# Patient Record
Sex: Female | Born: 1940 | Race: White | Hispanic: No | Smoking: Never smoker
Health system: Southern US, Community
[De-identification: ages and names within clinical notes are randomized; demographics above are authoritative.]

## PROBLEM LIST (undated history)

## (undated) DIAGNOSIS — G43909 Migraine, unspecified, not intractable, without status migrainosus: Secondary | ICD-10-CM

## (undated) DIAGNOSIS — K219 Gastro-esophageal reflux disease without esophagitis: Secondary | ICD-10-CM

## (undated) HISTORY — DX: Migraine, unspecified, not intractable, without status migrainosus: G43.909

## (undated) HISTORY — PX: TUBAL LIGATION: SHX77

## (undated) HISTORY — DX: Gastro-esophageal reflux disease without esophagitis: K21.9

## (undated) HISTORY — PX: OTHER SURGICAL HISTORY: SHX169

## (undated) HISTORY — PX: TONSILLECTOMY: SUR1361

---

## 2016-02-07 DIAGNOSIS — N840 Polyp of corpus uteri: Secondary | ICD-10-CM

## 2016-02-07 DIAGNOSIS — G43909 Migraine, unspecified, not intractable, without status migrainosus: Secondary | ICD-10-CM | POA: Insufficient documentation

## 2016-02-07 DIAGNOSIS — I10 Essential (primary) hypertension: Secondary | ICD-10-CM | POA: Insufficient documentation

## 2016-02-07 DIAGNOSIS — K219 Gastro-esophageal reflux disease without esophagitis: Secondary | ICD-10-CM

## 2016-02-07 DIAGNOSIS — R82998 Other abnormal findings in urine: Secondary | ICD-10-CM

## 2016-02-07 DIAGNOSIS — R300 Dysuria: Secondary | ICD-10-CM | POA: Insufficient documentation

## 2016-02-07 DIAGNOSIS — I471 Supraventricular tachycardia, unspecified: Secondary | ICD-10-CM

## 2016-02-07 DIAGNOSIS — E7849 Other hyperlipidemia: Secondary | ICD-10-CM

## 2016-02-07 HISTORY — DX: Dysuria: R30.0

## 2016-02-07 HISTORY — DX: Gastro-esophageal reflux disease without esophagitis: K21.9

## 2016-02-07 HISTORY — DX: Essential (primary) hypertension: I10

## 2016-02-07 HISTORY — DX: Supraventricular tachycardia, unspecified: I47.10

## 2016-02-07 HISTORY — DX: Polyp of corpus uteri: N84.0

## 2016-02-07 HISTORY — DX: Other hyperlipidemia: E78.49

## 2016-02-07 HISTORY — DX: Migraine, unspecified, not intractable, without status migrainosus: G43.909

## 2016-02-07 HISTORY — DX: Other abnormal findings in urine: R82.998

## 2017-06-28 ENCOUNTER — Encounter: Payer: Self-pay | Admitting: Cardiology

## 2017-06-29 ENCOUNTER — Ambulatory Visit (INDEPENDENT_AMBULATORY_CARE_PROVIDER_SITE_OTHER): Payer: Medicare Other | Admitting: Cardiology

## 2017-06-29 ENCOUNTER — Encounter: Payer: Self-pay | Admitting: Cardiology

## 2017-06-29 VITALS — BP 142/86 | HR 68 | Ht 64.0 in | Wt 145.5 lb

## 2017-06-29 DIAGNOSIS — I10 Essential (primary) hypertension: Secondary | ICD-10-CM | POA: Diagnosis not present

## 2017-06-29 DIAGNOSIS — I471 Supraventricular tachycardia: Secondary | ICD-10-CM

## 2017-06-29 DIAGNOSIS — E784 Other hyperlipidemia: Secondary | ICD-10-CM

## 2017-06-29 DIAGNOSIS — E7849 Other hyperlipidemia: Secondary | ICD-10-CM

## 2017-06-29 NOTE — Progress Notes (Signed)
Cardiology Office Note:    Date:  06/29/2017   ID:  Melanie Hampton, DOB July 15, 1941, MRN [id number]  PCP:  Irena Reichmann, DO  Cardiologist:  Norman Herrlich, MD    Referring MD: No ref. provider found    ASSESSMENT:    1. Benign essential hypertension   2. Paroxysmal supraventricular tachycardia (HCC) Chronic  3. Familial combined hyperlipidemia    PLAN:    In order of problems listed above:  1. Stable blood pressure target home runs less than 1:30 systolic and continue current treatment including beta blocker and diuretic. She is at risk for renal insufficiency and hypokalemia and labs were rechecked today 2. Stable continue beta blocker avoid over-the-counter proarrhythmic's 3. Stable continue her statin check liver function for toxicity and lipid profile for efficacy.  Next appointment: 6 months   Medication Adjustments/Labs and Tests Ordered: Current medicines are reviewed at length with the patient today.  Concerns regarding medicines are outlined above.  No orders of the defined types were placed in this encounter.  No orders of the defined types were placed in this encounter.   Chief Complaint  Patient presents with  . Follow-up    Routine follow up, pt has not been seen in 2 or more years  . Palpitations    pt states that she has occasional skipped beats    History of Present Illness:    Melanie Hampton is a 76 y.o. female with a hx of hypertension, hyperlipidemia and PSVT. Compliance with diet, lifestyle and medications: Yes Is still grieving the death of her husband and some minimal palpitations not severe sustained overall is pleased with all her compliant with medications and overdue for office follow-up lab studies. She has had no angina or dyspnea syncope or TIA. She avoids over-the-counter proarrhythmic medications. Past Medical History:  Diagnosis Date  . GERD (gastroesophageal reflux disease)   . Migraine     Past Surgical History:  Procedure Laterality  Date  . TONSILLECTOMY    . TUBAL LIGATION    . uterus polyp removal      Current Medications: Current Meds  Medication Sig  . Ascorbic Acid (VITAMIN C) 100 MG tablet Take 100 mg by mouth daily.  Marland Kitchen aspirin EC 81 MG tablet Take 81 mg by mouth daily.  Marland Kitchen atorvastatin (LIPITOR) 10 MG tablet Take 10 mg by mouth daily.  Marland Kitchen CRANBERRY PO Take by mouth. Takes as needed  . Loratadine-Pseudoephedrine (CLARITIN-D 24 HOUR PO) Take by mouth. Takes one tablet daily  . propranolol ER (INDERAL LA) 80 MG 24 hr capsule Take 80 mg by mouth daily.  . solifenacin (VESICARE) 5 MG tablet Take 5 mg by mouth daily.  Marland Kitchen triamterene-hydrochlorothiazide (MAXZIDE-25) 37.5-25 MG tablet Take by mouth. Takes one daily po daily     Allergies:   Niacin; Penicillin g; and Amoxicillin-pot clavulanate   Social History   Social History  . Marital status: Widowed    Spouse name: N/A  . Number of children: N/A  . Years of education: N/A   Social History Main Topics  . Smoking status: Never Smoker  . Smokeless tobacco: Never Used  . Alcohol use None  . Drug use: Unknown  . Sexual activity: Not Asked   Other Topics Concern  . None   Social History Narrative  . None     Family History: The patient's family history includes Cancer in her sister; Hypertension in her mother; Stroke in her father. ROS:   Please see the history of present illness.  All other systems reviewed and are negative.  EKGs/Labs/Other Studies Reviewed:    The following studies were reviewed today:   EKG:  EKG is  ordered today.  The ekg ordered today demonstrates Sinus rhythm left bundle branch block.  Recent Labs: No results found for requested labs within last 8760 hours.  Recent Lipid Panel No results found for: CHOL, TRIG, HDL, CHOLHDL, VLDL, LDLCALC, LDLDIRECT  Physical Exam:    VS:  BP (!) 142/86   Pulse 68   Ht 5\' 4"  (1.626 m)   Wt 145 lb 8 oz (66 kg)   SpO2 99%   BMI 24.98 kg/m     Wt Readings from Last 3  Encounters:  06/29/17 145 lb 8 oz (66 kg)     GEN:  Well nourished, well developed in no acute distress HEENT: Normal NECK: No JVD; No carotid bruits LYMPHATICS: No lymphadenopathy CARDIAC: RRR, no murmurs, rubs, gallops RESPIRATORY:  Clear to auscultation without rales, wheezing or rhonchi  ABDOMEN: Soft, non-tender, non-distended MUSCULOSKELETAL:  No edema; No deformity  SKIN: Warm and dry NEUROLOGIC:  Alert and oriented x 3 PSYCHIATRIC:  Normal affect    Signed, Norman HerrlichBrian Munley, MD  06/29/2017 11:29 AM    [facility]

## 2017-06-29 NOTE — Patient Instructions (Signed)
Medication Instructions:  Your physician recommends that you continue on your current medications as directed. Please refer to the Current Medication list given to you today.   Labwork: Your physician recommends that you return for lab work in: today. CMP, lipids.   Testing/Procedures: You had an EKG at your visit today.  Follow-Up: Your physician wants you to follow-up in: 6 months You will receive a reminder letter in the mail two months in advance. If you don't receive a letter, please call our office to schedule the follow-up appointment.   Any Other Special Instructions Will Be Listed Below (If Applicable).     If you need a refill on your cardiac medications before your next appointment, please call your pharmacy.

## 2017-06-30 LAB — COMPREHENSIVE METABOLIC PANEL
ALBUMIN: 4.7 g/dL (ref 3.5–4.8)
ALT: 17 IU/L (ref 0–32)
AST: 22 IU/L (ref 0–40)
Albumin/Globulin Ratio: 2.2 (ref 1.2–2.2)
Alkaline Phosphatase: 105 IU/L (ref 39–117)
BILIRUBIN TOTAL: 0.7 mg/dL (ref 0.0–1.2)
BUN / CREAT RATIO: 13 (ref 12–28)
BUN: 11 mg/dL (ref 8–27)
CO2: 28 mmol/L (ref 20–29)
CREATININE: 0.84 mg/dL (ref 0.57–1.00)
Calcium: 10.2 mg/dL (ref 8.7–10.3)
Chloride: 102 mmol/L (ref 96–106)
GFR calc Af Amer: 77 mL/min/{1.73_m2} (ref >59)
GFR, EST NON AFRICAN AMERICAN: 67 mL/min/{1.73_m2} (ref >59)
GLUCOSE: 94 mg/dL (ref 65–99)
Globulin, Total: 2.1 g/dL (ref 1.5–4.5)
Potassium: 4.5 mmol/L (ref 3.5–5.2)
Sodium: 140 mmol/L (ref 134–144)
Total Protein: 6.8 g/dL (ref 6.0–8.5)

## 2017-06-30 LAB — LIPID PANEL W/O CHOL/HDL RATIO
Cholesterol, Total: 199 mg/dL (ref 100–199)
HDL: 83 mg/dL (ref >39)
LDL CALC: 104 mg/dL — AB (ref 0–99)
Triglycerides: 59 mg/dL (ref 0–149)
VLDL CHOLESTEROL CAL: 12 mg/dL (ref 5–40)

## 2017-12-13 ENCOUNTER — Other Ambulatory Visit: Payer: Self-pay

## 2017-12-13 MED ORDER — PROPRANOLOL HCL ER 80 MG PO CP24: 80.0000 mg | ORAL_CAPSULE | Freq: Every day | ORAL | 2 refills | Status: DC

## 2018-02-02 DIAGNOSIS — R8290 Unspecified abnormal findings in urine: Secondary | ICD-10-CM | POA: Insufficient documentation

## 2018-02-02 HISTORY — DX: Unspecified abnormal findings in urine: R82.90

## 2018-03-02 NOTE — Progress Notes (Signed)
Cardiology Office Note:    Date:  03/03/2018   ID:  Melanie Hampton, DOB 08/04/1941, MRN [id number]  PCP:  [facility]  Cardiologist:  Norman HerrlichBrian Kramer Hanrahan, MD    Referring MD: Irena Reichmannollins, Dana, DO    ASSESSMENT:    1. Paroxysmal supraventricular tachycardia (HCC)   2. Benign essential hypertension   3. Familial combined hyperlipidemia    PLAN:    In order of problems listed above:  1. Stable continue her beta-blocker 2. Continue her current treatment including diuretic beta-blocker I discussed additional antihypertensive therapy and she assures me home blood pressures run 120-130 systolic.  3.  Continue her statin recheck labs liver function for safety lipid profile for efficacy 4.    Next appointment: 6 months   Medication Adjustments/Labs and Tests Ordered: Current medicines are reviewed at length with the patient today.  Concerns regarding medicines are outlined above.  Orders Placed This Encounter  Procedures  . Comprehensive Metabolic Panel (CMET)  . Lipid Profile   Meds ordered this encounter  Medications  . atorvastatin (LIPITOR) 10 MG tablet    Sig: Take 1 tablet (10 mg total) by mouth daily.    Dispense:  90 tablet    Refill:  3  . triamterene-hydrochlorothiazide (MAXZIDE-25) 37.5-25 MG tablet    Sig: Take 1 tablet by mouth daily. Takes one daily po daily    Dispense:  90 tablet    Refill:  3    Chief Complaint  Patient presents with  . Follow-up    6 month follow up appt   . Irregular Heart Beat  . Hypertension  . Hyperlipidemia    History of Present Illness:    Melanie Hampton is a 77 y.o. female with a hx of hypertension, hyperlipidemia and PSVT last seen 06/29/18.  ASSESSMENT: 06/29/18   1. Benign essential hypertension   2. Paroxysmal supraventricular tachycardia (HCC) Chronic  3. Familial combined hyperlipidemia    1. Stable blood pressure target home runs less than 1:30 systolic and continue current treatment including beta  blocker and diuretic. She is at risk for renal insufficiency and hypokalemia and labs were rechecked today 2. Stable continue beta blocker avoid over-the-counter proarrhythmic's 3. Stable continue her statin check liver function for toxicity and lipid profile for efficacy.  Compliance with diet, lifestyle and medications: yes She has done well she no longer is grieving its years since her husband died she has had very little palpitation not severe sustained Home blood pressures run 01/17/1929 systolic no chest pain shortness of breath TIA or syncope. Past Medical History:  Diagnosis Date  . GERD (gastroesophageal reflux disease)   . Migraine     Past Surgical History:  Procedure Laterality Date  . TONSILLECTOMY    . TUBAL LIGATION    . uterus polyp removal      Current Medications: Current Meds  Medication Sig  . aspirin EC 81 MG tablet Take 81 mg by mouth daily.  Marland Kitchen. atorvastatin (LIPITOR) 10 MG tablet Take 1 tablet (10 mg total) by mouth daily.  Marland Kitchen. CRANBERRY PO Take by mouth. Takes as needed  . esomeprazole (NEXIUM) 20 MG capsule Take 20 mg by mouth daily at 12 noon.  . Loratadine-Pseudoephedrine (CLARITIN-D 24 HOUR PO) Take by mouth. Takes one tablet daily  . propranolol ER (INDERAL LA) 80 MG 24 hr capsule Take 1 capsule (80 mg total) by mouth daily.  . solifenacin (VESICARE) 5 MG tablet Take 5 mg by mouth daily.  Marland Kitchen. triamterene-hydrochlorothiazide (MAXZIDE-25) 37.5-25  MG tablet Take 1 tablet by mouth daily. Takes one daily po daily  . [DISCONTINUED] atorvastatin (LIPITOR) 10 MG tablet Take 10 mg by mouth daily.  . [DISCONTINUED] triamterene-hydrochlorothiazide (MAXZIDE-25) 37.5-25 MG tablet Take 1 tablet by mouth daily. Takes one daily po daily     Allergies:   Niacin; Penicillin g; and Amoxicillin-pot clavulanate   Social History   Socioeconomic History  . Marital status: Widowed    Spouse name: None  . Number of children: None  . Years of education: None  . Highest  education level: None  Social Needs  . Financial resource strain: None  . Food insecurity - worry: None  . Food insecurity - inability: None  . Transportation needs - medical: None  . Transportation needs - non-medical: None  Occupational History  . None  Tobacco Use  . Smoking status: Never Smoker  . Smokeless tobacco: Never Used  Substance and Sexual Activity  . Alcohol use: No    Frequency: Never  . Drug use: No  . Sexual activity: None  Other Topics Concern  . None  Social History Narrative  . None     Family History: The patient's family history includes Cancer in her sister; Hypertension in her mother; Stroke in her father. ROS:   Please see the history of present illness.    All other systems reviewed and are negative.  EKGs/Labs/Other Studies Reviewed:    The following studies were reviewed today:  Recent Labs: 06/29/2017: ALT 17; BUN 11; Creatinine, Ser 0.84; Potassium 4.5; Sodium 140  Recent Lipid Panel    Component Value Date/Time   CHOL 199 06/29/2017 1220   TRIG 59 06/29/2017 1220   HDL 83 06/29/2017 1220   LDLCALC 104 (H) 06/29/2017 1220    Physical Exam:    VS:  BP (!) 148/76 (BP Location: Right Arm, Patient Position: Sitting, Cuff Size: Normal)   Pulse 61   Ht 5\' 4"  (1.626 m)   Wt 149 lb 1.9 oz (67.6 kg)   SpO2 98%   BMI 25.60 kg/m     Wt Readings from Last 3 Encounters:  03/03/18 149 lb 1.9 oz (67.6 kg)  06/29/17 145 lb 8 oz (66 kg)     GEN:  Well nourished, well developed in no acute distress HEENT: Normal NECK: No JVD; No carotid bruits LYMPHATICS: No lymphadenopathy CARDIAC: RRR, no murmurs, rubs, gallops RESPIRATORY:  Clear to auscultation without rales, wheezing or rhonchi  ABDOMEN: Soft, non-tender, non-distended MUSCULOSKELETAL:  No edema; No deformity  SKIN: Warm and dry NEUROLOGIC:  Alert and oriented x 3 PSYCHIATRIC:  Normal affect    Signed, Norman Herrlich, MD  03/03/2018 2:47 PM    [facility]

## 2018-03-03 ENCOUNTER — Encounter: Payer: Self-pay | Admitting: Cardiology

## 2018-03-03 ENCOUNTER — Ambulatory Visit (INDEPENDENT_AMBULATORY_CARE_PROVIDER_SITE_OTHER): Payer: Medicare Other | Admitting: Cardiology

## 2018-03-03 VITALS — BP 148/76 | HR 61 | Ht 64.0 in | Wt 149.1 lb

## 2018-03-03 DIAGNOSIS — I10 Essential (primary) hypertension: Secondary | ICD-10-CM | POA: Diagnosis not present

## 2018-03-03 DIAGNOSIS — I471 Supraventricular tachycardia: Secondary | ICD-10-CM

## 2018-03-03 DIAGNOSIS — E7849 Other hyperlipidemia: Secondary | ICD-10-CM

## 2018-03-03 MED ORDER — ATORVASTATIN CALCIUM 10 MG PO TABS: 10.0000 mg | ORAL_TABLET | Freq: Every day | ORAL | 3 refills | Status: DC

## 2018-03-03 MED ORDER — TRIAMTERENE-HCTZ 37.5-25 MG PO TABS: 1.0000 | ORAL_TABLET | Freq: Every day | ORAL | 3 refills | Status: DC

## 2018-03-03 NOTE — Patient Instructions (Signed)
Medication Instructions:  Your physician recommends that you continue on your current medications as directed. Please refer to the Current Medication list given to you today.   Labwork: Your physician recommends that you return for lab work today: lipid panel, CMP.   Testing/Procedures: None  Follow-Up: Your physician wants you to follow-up in: 6 months. You will receive a reminder letter in the mail two months in advance. If you don't receive a letter, please call our office to schedule the follow-up appointment.   Any Other Special Instructions Will Be Listed Below (If Applicable).     If you need a refill on your cardiac medications before your next appointment, please call your pharmacy.

## 2018-03-04 LAB — COMPREHENSIVE METABOLIC PANEL
ALBUMIN: 4.7 g/dL (ref 3.5–4.8)
ALK PHOS: 99 IU/L (ref 39–117)
ALT: 16 IU/L (ref 0–32)
AST: 20 IU/L (ref 0–40)
Albumin/Globulin Ratio: 2.4 — ABNORMAL HIGH (ref 1.2–2.2)
BILIRUBIN TOTAL: 0.6 mg/dL (ref 0.0–1.2)
BUN / CREAT RATIO: 17 (ref 12–28)
BUN: 15 mg/dL (ref 8–27)
CHLORIDE: 100 mmol/L (ref 96–106)
CO2: 28 mmol/L (ref 20–29)
CREATININE: 0.86 mg/dL (ref 0.57–1.00)
Calcium: 10.3 mg/dL (ref 8.7–10.3)
GFR calc Af Amer: 75 mL/min/{1.73_m2} (ref >59)
GFR calc non Af Amer: 65 mL/min/{1.73_m2} (ref >59)
GLOBULIN, TOTAL: 2 g/dL (ref 1.5–4.5)
Glucose: 90 mg/dL (ref 65–99)
Potassium: 4.8 mmol/L (ref 3.5–5.2)
SODIUM: 140 mmol/L (ref 134–144)
Total Protein: 6.7 g/dL (ref 6.0–8.5)

## 2018-03-04 LAB — LIPID PANEL
CHOLESTEROL TOTAL: 198 mg/dL (ref 100–199)
Chol/HDL Ratio: 2.4 ratio (ref 0.0–4.4)
HDL: 82 mg/dL (ref >39)
LDL Calculated: 104 mg/dL — ABNORMAL HIGH (ref 0–99)
Triglycerides: 58 mg/dL (ref 0–149)
VLDL Cholesterol Cal: 12 mg/dL (ref 5–40)

## 2018-10-11 ENCOUNTER — Ambulatory Visit: Payer: Medicare Other | Admitting: Cardiology

## 2018-10-14 ENCOUNTER — Other Ambulatory Visit: Payer: Self-pay | Admitting: Cardiology

## 2018-11-21 NOTE — Progress Notes (Signed)
Cardiology Office Note:    Date:  11/22/2018   ID:  Lucille Passy, DOB 02/06/1941, MRN [id number]  PCP:  [place]  Cardiologist:  Shirlee More, MD    Referring MD: [place] He*    ASSESSMENT:    1. Paroxysmal supraventricular tachycardia ([place])   2. Benign essential hypertension   3. Familial combined hyperlipidemia   4. Other fatigue    PLAN:    In order of problems listed above:  1. Stable no recurrence continue her beta-blocker.  She been on propranolol long-term and I doubt it is the cause of her fatigue although we could consider altering to a different medication if unimproved 2. Stable continue current treatment beta-blocker and diuretic check renal function and potassium with fatigue 3. Continue her statin check liver function lipid profile 4. Further evaluation including CBC and TSH   Next appointment:.  6 months   Medication Adjustments/Labs and Tests Ordered: Current medicines are reviewed at length with the patient today.  Concerns regarding medicines are outlined above.  Orders Placed This Encounter  Procedures  . Lipid Profile  . Comp Met (CMET)  . CBC  . TSH  . EKG 12-Lead   No orders of the defined types were placed in this encounter.   Chief Complaint  Patient presents with  . Follow-up    SVT  . Hypertension  . Hyperlipidemia    History of Present Illness:    Melanie Hampton is a 77 y.o. female with a hx of  hypertension, hyperlipidemia and PSVT  last seen 03/03/18. Compliance with diet, lifestyle and medications: Yes  She remains very active has no chest pain palpitation shortness of breath but thinks she has disproportionate fatigue and requests further evaluation as part of her labs today we will check a CBC and thyroid. Past Medical History:  Diagnosis Date  . GERD (gastroesophageal reflux disease)   . Migraine     Past Surgical History:  Procedure Laterality Date  . TONSILLECTOMY    . TUBAL  LIGATION    . uterus polyp removal      Current Medications: Current Meds  Medication Sig  . aspirin EC 81 MG tablet Take 81 mg by mouth daily.  Marland Kitchen atorvastatin (LIPITOR) 10 MG tablet Take 1 tablet (10 mg total) by mouth daily.  Marland Kitchen CRANBERRY PO Take by mouth. Takes as needed  . cyanocobalamin 1000 MCG tablet Take 1,000 mcg by mouth daily.  Marland Kitchen esomeprazole (NEXIUM) 20 MG capsule Take 20 mg by mouth daily at 12 noon.  . Loratadine-Pseudoephedrine (CLARITIN-D 24 HOUR PO) Take by mouth. Takes one tablet daily  . propranolol ER (INDERAL LA) 80 MG 24 hr capsule TAKE 1 CAPSULE BY MOUTH ONCE DAILY  . solifenacin (VESICARE) 5 MG tablet Take 5 mg by mouth daily.  Marland Kitchen triamterene-hydrochlorothiazide (MAXZIDE-25) 37.5-25 MG tablet Take 1 tablet by mouth daily. Takes one daily po daily     Allergies:   Niacin; Penicillin g; and Amoxicillin-pot clavulanate   Social History   Socioeconomic History  . Marital status: Widowed    Spouse name: Not on file  . Number of children: Not on file  . Years of education: Not on file  . Highest education level: Not on file  Occupational History  . Not on file  Social Needs  . Financial resource strain: Not on file  . Food insecurity:    Worry: Not on file    Inability: Not on file  . Transportation needs:  Medical: Not on file    Non-medical: Not on file  Tobacco Use  . Smoking status: Never Smoker  . Smokeless tobacco: Never Used  Substance and Sexual Activity  . Alcohol use: No    Frequency: Never  . Drug use: No  . Sexual activity: Not on file  Lifestyle  . Physical activity:    Days per week: Not on file    Minutes per session: Not on file  . Stress: Not on file  Relationships  . Social connections:    Talks on phone: Not on file    Gets together: Not on file    Attends religious service: Not on file    Active member of club or organization: Not on file    Attends meetings of clubs or organizations: Not on file    Relationship status:  Not on file  Other Topics Concern  . Not on file  Social History Narrative  . Not on file     Family History: The patient's family history includes Cancer in her sister; Hypertension in her mother; Stroke in her father. ROS:   Please see the history of present illness.    All other systems reviewed and are negative.  EKGs/Labs/Other Studies Reviewed:    The following studies were reviewed today:  EKG:  EKG ordered today.  The ekg ordered today demonstrates sinus rhythm interventricular conduction delay  Recent Labs: 03/03/2018: ALT 16; BUN 15; Creatinine, Ser 0.86; Potassium 4.8; Sodium 140  Recent Lipid Panel    Component Value Date/Time   CHOL 198 03/03/2018 1457   TRIG 58 03/03/2018 1457   HDL 82 03/03/2018 1457   CHOLHDL 2.4 03/03/2018 1457   LDLCALC 104 (H) 03/03/2018 1457    Physical Exam:    VS:  BP 112/76 (BP Location: Right Arm, Patient Position: Sitting, Cuff Size: Normal)   Pulse 72   Ht 5' 4"  (1.626 m)   Wt 143 lb (64.9 kg)   SpO2 96%   BMI 24.55 kg/m     Wt Readings from Last 3 Encounters:  11/22/18 143 lb (64.9 kg)  03/03/18 149 lb 1.9 oz (67.6 kg)  06/29/17 145 lb 8 oz (66 kg)     GEN:  Well nourished, well developed in no acute distress HEENT: Normal NECK: No JVD; No carotid bruits LYMPHATICS: No lymphadenopathy CARDIAC: RRR, no murmurs, rubs, gallops RESPIRATORY:  Clear to auscultation without rales, wheezing or rhonchi  ABDOMEN: Soft, non-tender, non-distended MUSCULOSKELETAL:  No edema; No deformity  SKIN: Warm and dry NEUROLOGIC:  Alert and oriented x 3 PSYCHIATRIC:  Normal affect    Signed, Shirlee More, MD  11/22/2018 10:41 AM    [place]

## 2018-11-22 ENCOUNTER — Ambulatory Visit: Payer: Medicare Other | Admitting: Cardiology

## 2018-11-22 VITALS — BP 112/76 | HR 72 | Ht 64.0 in | Wt 143.0 lb

## 2018-11-22 DIAGNOSIS — I471 Supraventricular tachycardia: Secondary | ICD-10-CM | POA: Diagnosis not present

## 2018-11-22 DIAGNOSIS — R5383 Other fatigue: Secondary | ICD-10-CM | POA: Diagnosis not present

## 2018-11-22 DIAGNOSIS — E7849 Other hyperlipidemia: Secondary | ICD-10-CM

## 2018-11-22 DIAGNOSIS — I10 Essential (primary) hypertension: Secondary | ICD-10-CM | POA: Diagnosis not present

## 2018-11-22 NOTE — Patient Instructions (Signed)
Medication Instructions:  Your physician recommends that you continue on your current medications as directed. Please refer to the Current Medication list given to you today.  If you need a refill on your cardiac medications before your next appointment, please call your pharmacy.   Lab work: Your physician recommends that you return for lab work today: lipid panel, CMP, CBC, TSH.   If you have labs (blood work) drawn today and your tests are completely normal, you will receive your results only by: Marland Kitchen. MyChart Message (if you have MyChart) OR . A paper copy in the mail If you have any lab test that is abnormal or we need to change your treatment, we will call you to review the results.  Testing/Procedures: You had an EKG today.   Follow-Up: At [facility], you and your health needs are our priority.  As part of our continuing mission to provide you with exceptional heart care, we have created designated Provider Care Teams.  These Care Teams include your primary Cardiologist (physician) and Advanced Practice Providers (APPs -  Physician Assistants and Nurse Practitioners) who all work together to provide you with the care you need, when you need it. You will need a follow up appointment in 1 years.  Please call our office 2 months in advance to schedule this appointment.

## 2018-11-23 LAB — COMPREHENSIVE METABOLIC PANEL
ALBUMIN: 4.6 g/dL (ref 3.5–4.8)
ALT: 14 IU/L (ref 0–32)
AST: 17 IU/L (ref 0–40)
Albumin/Globulin Ratio: 2.3 — ABNORMAL HIGH (ref 1.2–2.2)
Alkaline Phosphatase: 96 IU/L (ref 39–117)
BILIRUBIN TOTAL: 0.5 mg/dL (ref 0.0–1.2)
BUN / CREAT RATIO: 14 (ref 12–28)
BUN: 13 mg/dL (ref 8–27)
CHLORIDE: 100 mmol/L (ref 96–106)
CO2: 25 mmol/L (ref 20–29)
Calcium: 10.4 mg/dL — ABNORMAL HIGH (ref 8.7–10.3)
Creatinine, Ser: 0.91 mg/dL (ref 0.57–1.00)
GFR calc Af Amer: 69 mL/min/{1.73_m2} (ref >59)
GFR calc non Af Amer: 60 mL/min/{1.73_m2} (ref >59)
GLUCOSE: 101 mg/dL — AB (ref 65–99)
Globulin, Total: 2 g/dL (ref 1.5–4.5)
Potassium: 5 mmol/L (ref 3.5–5.2)
Sodium: 141 mmol/L (ref 134–144)
Total Protein: 6.6 g/dL (ref 6.0–8.5)

## 2018-11-23 LAB — CBC
HEMOGLOBIN: 14.5 g/dL (ref 11.1–15.9)
Hematocrit: 43.9 % (ref 34.0–46.6)
MCH: 30 pg (ref 26.6–33.0)
MCHC: 33 g/dL (ref 31.5–35.7)
MCV: 91 fL (ref 79–97)
PLATELETS: 253 10*3/uL (ref 150–450)
RBC: 4.84 x10E6/uL (ref 3.77–5.28)
RDW: 12.3 % (ref 12.3–15.4)
WBC: 4.3 10*3/uL (ref 3.4–10.8)

## 2018-11-23 LAB — LIPID PANEL
Chol/HDL Ratio: 2.6 ratio (ref 0.0–4.4)
Cholesterol, Total: 201 mg/dL — ABNORMAL HIGH (ref 100–199)
HDL: 78 mg/dL (ref >39)
LDL CALC: 110 mg/dL — AB (ref 0–99)
Triglycerides: 65 mg/dL (ref 0–149)
VLDL Cholesterol Cal: 13 mg/dL (ref 5–40)

## 2018-11-23 LAB — TSH: TSH: 3.3 u[IU]/mL (ref 0.450–4.500)

## 2019-01-17 DIAGNOSIS — J309 Allergic rhinitis, unspecified: Secondary | ICD-10-CM

## 2019-01-17 HISTORY — DX: Allergic rhinitis, unspecified: J30.9

## 2019-03-04 ENCOUNTER — Other Ambulatory Visit: Payer: Self-pay | Admitting: Cardiology

## 2019-03-08 DIAGNOSIS — Z0000 Encounter for general adult medical examination without abnormal findings: Secondary | ICD-10-CM

## 2019-03-08 DIAGNOSIS — N393 Stress incontinence (female) (male): Secondary | ICD-10-CM

## 2019-03-08 HISTORY — DX: Stress incontinence (female) (male): N39.3

## 2019-03-08 HISTORY — DX: Encounter for general adult medical examination without abnormal findings: Z00.00

## 2019-03-11 ENCOUNTER — Other Ambulatory Visit: Payer: Self-pay | Admitting: Cardiology

## 2019-03-16 ENCOUNTER — Telehealth: Payer: Self-pay | Admitting: Cardiology

## 2019-03-16 NOTE — Telephone Encounter (Signed)
Has been having some problems with afib

## 2019-03-16 NOTE — Telephone Encounter (Signed)
Attempted to contact patient with no answer, unable to leave message. Will continue efforts.  

## 2019-03-17 NOTE — Telephone Encounter (Signed)
No answer on phone number provided, unable to leave voicemail. Will continue efforts.

## 2019-03-20 NOTE — Telephone Encounter (Signed)
Attempted to contact patient with no answer, unable to leave message.

## 2019-04-27 ENCOUNTER — Telehealth: Payer: Self-pay | Admitting: *Deleted

## 2019-04-27 NOTE — Telephone Encounter (Signed)
Pt says never got call back when called in March. I let her know we attempted to call pt back 3 times and unable to leave msg. She didn't know what was wrong with machine. Pt is still having some skipping beats, BP is better, at 12:37 was 141/84 HR was 66. BP is going up and down. Pt currently has kidney infection and on 2 diff antibiotics/ 2nd round. No chest pain, no shortness of breath, some swelling in feet. Does she need to be seen in office or does her meds needed adjusting?

## 2019-04-28 NOTE — Telephone Encounter (Signed)
Attempted to call patient twice. Calls were disconnected, no voicemail or answering machine. Will try again later this afternoon.

## 2019-04-30 DIAGNOSIS — R399 Unspecified symptoms and signs involving the genitourinary system: Secondary | ICD-10-CM

## 2019-04-30 DIAGNOSIS — J019 Acute sinusitis, unspecified: Secondary | ICD-10-CM | POA: Insufficient documentation

## 2019-04-30 HISTORY — DX: Unspecified symptoms and signs involving the genitourinary system: R39.9

## 2019-04-30 HISTORY — DX: Acute sinusitis, unspecified: J01.90

## 2019-05-01 NOTE — Telephone Encounter (Signed)
Phoned patient to schedule f/u appt with Dr. Dulce Sellar. No answer, no voicemail. Attempted to call both daughters listed on DPR, no answer for 1 daughter, other daughter number no longer in service.

## 2019-05-01 NOTE — Telephone Encounter (Signed)
He needs to be set up for virtual visit

## 2019-05-01 NOTE — Telephone Encounter (Signed)
Phoned patient who reports episode of pounding heartbeat lasting 4 hours several weeks ago and elevated BP. She normally does not feel an irregular or pounding heart beat. Her normal BP's are in the 120's-130's/60-70's, but recently she's twice had BP's in the 153/90 range. She denies swelling, shortness of breath or any chest discomfort/tightness. She would like to be advised what to do.     Medications reviewed, she's taking as ordered, she does report currently having both a sinus infection and a UTI and is taking Nitrofurantin and cephalexin antibiotics for that.  pls advise.

## 2019-05-04 NOTE — Telephone Encounter (Signed)
Left message on patient's home phone to return call to get her scheduled for a virtual appointment per Dr Dulce Sellar.  Will continue efforts.

## 2019-05-05 NOTE — Telephone Encounter (Signed)
Pt thinks the reason her BP has been up is due to sinus infection and kidney infection. 4/24 was put on antibiotics for this. 4/30 antibiotics were changed due to double infection in kidneys (2 antibiotics). Pt is going back to get urine checked today. Her BP is doing better: 134/81 and 117/73 this am. Does she still need appt. With Dr. Dulce Sellar or can we put this off or does she still need visit? Please advise.

## 2019-05-05 NOTE — Telephone Encounter (Signed)
Noted. Follow up routinely per Dr. Dulce Sellar

## 2019-05-05 NOTE — Telephone Encounter (Signed)
Left message on patient's voicemail that Dr Dulce Sellar is aware of what is going on and that she can follow up with Dr Dulce Sellar routinely.  Advised patient to contact our office with any future problems or concerns.

## 2019-05-10 ENCOUNTER — Other Ambulatory Visit: Payer: Self-pay | Admitting: *Deleted

## 2019-05-10 ENCOUNTER — Other Ambulatory Visit: Payer: Self-pay | Admitting: Cardiology

## 2019-10-13 ENCOUNTER — Other Ambulatory Visit: Payer: Self-pay | Admitting: Cardiology

## 2019-11-29 NOTE — Progress Notes (Signed)
Cardiology Office Note:    Date:  11/30/2019   ID:  Melanie Hampton, DOB 07/11/1941, MRN [id number]  PCP:  [facility]  Cardiologist:  Shirlee More, MD    Referring MD: [place] He*    ASSESSMENT:    1. Paroxysmal supraventricular tachycardia ([place])   2. Benign essential hypertension   3. Familial combined hyperlipidemia    PLAN:    In order of problems listed above:  1. Stable no clinical recurrence she will continue her beta-blocker 2. Stable BP at target repeat by me 140/80.  Continue current treatment including diuretic check renal function potassium 3. Stable continue her statin check liver function lipid profile   Next appointment: 1 year   Medication Adjustments/Labs and Tests Ordered: Current medicines are reviewed at length with the patient today.  Concerns regarding medicines are outlined above.  No orders of the defined types were placed in this encounter.  No orders of the defined types were placed in this encounter.   No chief complaint on file.   History of Present Illness:    Melanie Hampton is a 78 y.o. female with a hx of  hypertension, hyperlipidemia and PSVT  last seen 11/23/2019. Compliance with diet, lifestyle and medications: Yes  She had a family COVID-19 exposure but fortunately did not develop the disease.  She is an intelligent woman and does all the usual and effective protections and I asked her to start wearing eye protection in stores and indoor buildings with crowns.  She has had no recurrent palpitation or SVT no edema shortness of breath chest pain or syncope.  Is overdue for labs and will be performed today EKG shows a stable pattern of sinus rhythm and nonspecific conduction delay atypical left bundle branch block Past Medical History:  Diagnosis Date  . GERD (gastroesophageal reflux disease)   . Migraine     Past Surgical History:  Procedure Laterality Date  . TONSILLECTOMY    . TUBAL LIGATION    .  uterus polyp removal      Current Medications: Current Meds  Medication Sig  . aspirin EC 81 MG tablet Take 81 mg by mouth daily.  Marland Kitchen atorvastatin (LIPITOR) 10 MG tablet Take 1 tablet by mouth once daily  . Cholecalciferol (VITAMIN D-3 PO) Take 1 tablet by mouth daily.  Marland Kitchen CRANBERRY PO Take 1 tablet by mouth daily.   . cyanocobalamin 1000 MCG tablet Take 1,000 mcg by mouth daily.  Marland Kitchen esomeprazole (NEXIUM) 20 MG capsule Take 20 mg by mouth daily at 12 noon.  . loratadine (CLARITIN) 10 MG tablet Take 10 mg by mouth daily.  . Multiple Vitamins-Minerals (ZINC PO) Take 1 tablet by mouth daily.  Marland Kitchen MYRBETRIQ 50 MG TB24 tablet Take 50 mg by mouth daily.  . propranolol ER (INDERAL LA) 80 MG 24 hr capsule Take 1 capsule by mouth once daily  . triamterene-hydrochlorothiazide (MAXZIDE-25) 37.5-25 MG tablet TAKE 1 TABLET BY MOUTH ONCE DAILY     Allergies:   Niacin, Penicillin g, and Amoxicillin-pot clavulanate   Social History   Socioeconomic History  . Marital status: Widowed    Spouse name: Not on file  . Number of children: Not on file  . Years of education: Not on file  . Highest education level: Not on file  Occupational History  . Not on file  Social Needs  . Financial resource strain: Not on file  . Food insecurity    Worry: Not on file  Inability: Not on file  . Transportation needs    Medical: Not on file    Non-medical: Not on file  Tobacco Use  . Smoking status: Never Smoker  . Smokeless tobacco: Never Used  Substance and Sexual Activity  . Alcohol use: No    Frequency: Never  . Drug use: No  . Sexual activity: Not on file  Lifestyle  . Physical activity    Days per week: Not on file    Minutes per session: Not on file  . Stress: Not on file  Relationships  . Social Musician on phone: Not on file    Gets together: Not on file    Attends religious service: Not on file    Active member of club or organization: Not on file    Attends meetings of clubs  or organizations: Not on file    Relationship status: Not on file  Other Topics Concern  . Not on file  Social History Narrative  . Not on file     Family History: The patient's family history includes Cancer in her sister; Hypertension in her mother; Stroke in her father. ROS:   Please see the history of present illness.    All other systems reviewed and are negative.  EKGs/Labs/Other Studies Reviewed:    The following studies were reviewed today:   Recent Labs: 11/22/2018: Cholesterol 201 HDL 78 LDL 110 creatinine 4.48 TSH normal 3.30 Recent Lipid Panel    Component Value Date/Time   CHOL 201 (H) 11/22/2018 1038   TRIG 65 11/22/2018 1038   HDL 78 11/22/2018 1038   CHOLHDL 2.6 11/22/2018 1038   LDLCALC 110 (H) 11/22/2018 1038    Physical Exam:    VS:  BP (!) 156/92 (BP Location: Right Arm, Patient Position: Sitting, Cuff Size: Normal)   Pulse 68   Ht 5\' 4"  (1.626 m)   Wt 133 lb 6.4 oz (60.5 kg)   SpO2 99%   BMI 22.90 kg/m     Wt Readings from Last 3 Encounters:  11/30/19 133 lb 6.4 oz (60.5 kg)  11/22/18 143 lb (64.9 kg)  03/03/18 149 lb 1.9 oz (67.6 kg)     GEN: 1 Well nourished, well developed in no acute distress HEENT: Normal NECK: No JVD; No carotid bruits LYMPHATICS: No lymphadenopathy CARDIAC: RRR, no murmurs, rubs, gallops RESPIRATORY:  Clear to auscultation without rales, wheezing or rhonchi  ABDOMEN: Soft, non-tender, non-distended MUSCULOSKELETAL:  No edema; No deformity  SKIN: Warm and dry NEUROLOGIC:  Alert and oriented x 3 PSYCHIATRIC:  Normal affect    Signed, 05/03/18, MD  11/30/2019 11:00 AM    [facility]

## 2019-11-30 ENCOUNTER — Ambulatory Visit (INDEPENDENT_AMBULATORY_CARE_PROVIDER_SITE_OTHER): Payer: Medicare Other | Admitting: Cardiology

## 2019-11-30 ENCOUNTER — Other Ambulatory Visit: Payer: Self-pay

## 2019-11-30 ENCOUNTER — Encounter: Payer: Self-pay | Admitting: Cardiology

## 2019-11-30 VITALS — BP 156/92 | HR 68 | Ht 64.0 in | Wt 133.4 lb

## 2019-11-30 DIAGNOSIS — I10 Essential (primary) hypertension: Secondary | ICD-10-CM | POA: Diagnosis not present

## 2019-11-30 DIAGNOSIS — E7849 Other hyperlipidemia: Secondary | ICD-10-CM

## 2019-11-30 DIAGNOSIS — I471 Supraventricular tachycardia: Secondary | ICD-10-CM

## 2019-11-30 MED ORDER — ATORVASTATIN CALCIUM 10 MG PO TABS: 10.0000 mg | ORAL_TABLET | Freq: Every day | ORAL | 3 refills | Status: DC

## 2019-11-30 MED ORDER — PROPRANOLOL HCL ER 80 MG PO CP24: 80.0000 mg | ORAL_CAPSULE | Freq: Every day | ORAL | 3 refills | Status: DC

## 2019-11-30 MED ORDER — TRIAMTERENE-HCTZ 37.5-25 MG PO TABS: 1.0000 | ORAL_TABLET | Freq: Every day | ORAL | 3 refills | Status: DC

## 2019-11-30 NOTE — Patient Instructions (Addendum)
Medication Instructions:  Your physician recommends that you continue on your current medications as directed. Please refer to the Current Medication list given to you today.  *If you need a refill on your cardiac medications before your next appointment, please call your pharmacy*  Lab Work: Your physician recommends that you return for lab work today: CMP, lipid panel.   If you have labs (blood work) drawn today and your tests are completely normal, you will receive your results only by: Marland Kitchen MyChart Message (if you have MyChart) OR . A paper copy in the mail If you have any lab test that is abnormal or we need to change your treatment, we will call you to review the results.  Testing/Procedures: You had an EKG today.   Follow-Up: At [facility], you and your health needs are our priority.  As part of our continuing mission to provide you with exceptional heart care, we have created designated Provider Care Teams.  These Care Teams include your primary Cardiologist (physician) and Advanced Practice Providers (APPs -  Physician Assistants and Nurse Practitioners) who all work together to provide you with the care you need, when you need it.  Your next appointment:   1 year(s)  The format for your next appointment:   In Person  Provider:   Shirlee More, MD    Purchase on line at [facility] or at [facility]

## 2019-12-01 LAB — COMPREHENSIVE METABOLIC PANEL
ALT: 17 IU/L (ref 0–32)
AST: 25 IU/L (ref 0–40)
Albumin/Globulin Ratio: 2.6 — ABNORMAL HIGH (ref 1.2–2.2)
Albumin: 5 g/dL — ABNORMAL HIGH (ref 3.7–4.7)
Alkaline Phosphatase: 106 IU/L (ref 39–117)
BUN/Creatinine Ratio: 18 (ref 12–28)
BUN: 17 mg/dL (ref 8–27)
Bilirubin Total: 0.5 mg/dL (ref 0.0–1.2)
CO2: 29 mmol/L (ref 20–29)
Calcium: 10.6 mg/dL — ABNORMAL HIGH (ref 8.7–10.3)
Chloride: 99 mmol/L (ref 96–106)
Creatinine, Ser: 0.97 mg/dL (ref 0.57–1.00)
GFR calc Af Amer: 64 mL/min/{1.73_m2} (ref >59)
GFR calc non Af Amer: 55 mL/min/{1.73_m2} — ABNORMAL LOW (ref >59)
Globulin, Total: 1.9 g/dL (ref 1.5–4.5)
Glucose: 99 mg/dL (ref 65–99)
Potassium: 5.4 mmol/L — ABNORMAL HIGH (ref 3.5–5.2)
Sodium: 138 mmol/L (ref 134–144)
Total Protein: 6.9 g/dL (ref 6.0–8.5)

## 2019-12-01 LAB — LIPID PANEL
Chol/HDL Ratio: 2.4 ratio (ref 0.0–4.4)
Cholesterol, Total: 196 mg/dL (ref 100–199)
HDL: 83 mg/dL (ref >39)
LDL Chol Calc (NIH): 103 mg/dL — ABNORMAL HIGH (ref 0–99)
Triglycerides: 54 mg/dL (ref 0–149)
VLDL Cholesterol Cal: 10 mg/dL (ref 5–40)

## 2019-12-20 ENCOUNTER — Telehealth: Payer: Self-pay | Admitting: Cardiology

## 2019-12-20 NOTE — Telephone Encounter (Signed)
Her BP is going crazy and she wants to take an extra BP pill

## 2019-12-20 NOTE — Telephone Encounter (Signed)
Recent blood pressure reading for this afternoon 152/90, pulse of 65 left arm pain/ headache, blood pressure this morning morning 124/72 pulse 59. Yesterday afternoon 154/91 HR 82. She has been noticing either normal to low blood pressure reading or elevated blood pressure. She stated normally take she has been checking her blood pressure when she isn't feeling well. She has been checking her blood pressure when she gets a headache. She also said she takes her blood pressure medications at the same time typicall around lunch time or sometimes in the afternoon.

## 2020-01-16 ENCOUNTER — Other Ambulatory Visit: Payer: Self-pay

## 2020-01-16 ENCOUNTER — Ambulatory Visit: Payer: Medicare Other | Admitting: Cardiology

## 2020-01-16 ENCOUNTER — Ambulatory Visit (INDEPENDENT_AMBULATORY_CARE_PROVIDER_SITE_OTHER): Payer: Medicare PPO | Admitting: Cardiology

## 2020-01-16 ENCOUNTER — Ambulatory Visit (INDEPENDENT_AMBULATORY_CARE_PROVIDER_SITE_OTHER): Payer: Medicare PPO

## 2020-01-16 ENCOUNTER — Encounter: Payer: Self-pay | Admitting: Cardiology

## 2020-01-16 ENCOUNTER — Telehealth: Payer: Self-pay | Admitting: Cardiology

## 2020-01-16 VITALS — BP 140/80 | HR 79 | Ht 64.0 in | Wt 132.8 lb

## 2020-01-16 DIAGNOSIS — R55 Syncope and collapse: Secondary | ICD-10-CM | POA: Diagnosis not present

## 2020-01-16 DIAGNOSIS — I10 Essential (primary) hypertension: Secondary | ICD-10-CM | POA: Diagnosis not present

## 2020-01-16 DIAGNOSIS — E7849 Other hyperlipidemia: Secondary | ICD-10-CM

## 2020-01-16 DIAGNOSIS — I471 Supraventricular tachycardia: Secondary | ICD-10-CM | POA: Diagnosis not present

## 2020-01-16 NOTE — Addendum Note (Signed)
Addended by: Roosvelt Harps R on: 01/16/2020 04:51 PM   Modules accepted: Orders

## 2020-01-16 NOTE — Progress Notes (Signed)
Cardiology Office Note:    Date:  01/16/2020   ID:  Melanie Hampton, DOB 1941/02/11, MRN [id number]  PCP:  [facility]  Cardiologist:  Norman Herrlich, MD    Referring MD: [facility]    ASSESSMENT:    1. Near syncope   2. Benign essential hypertension   3. Familial combined hyperlipidemia   4. Paroxysmal supraventricular tachycardia (HCC)    PLAN:    In order of problems listed above:  1. Vague episode differential diagnosis includes both tachycardia and bradycardia arrhythmia and CNS side effects over-the-counter Tylenol sinus.  Her blood pressures at target she will continue her current antihypertensives and utilize a 7-day ZIO monitor and decision making afterwards. 2. BP is at target should continue current treatment including diuretic beta-blocker 3. Stable lipids are ideal continue statin 4. Continue beta-blocker utilize a 1 week CO monitor to assess for breakthrough tachyarrhythmia   Next appointment: 6 weeks   Medication Adjustments/Labs and Tests Ordered: Current medicines are reviewed at length with the patient today.  Concerns regarding medicines are outlined above.  No orders of the defined types were placed in this encounter.  No orders of the defined types were placed in this encounter.   Chief Complaint  Patient presents with  . Follow-up    She has to be seen in the office she had an episode this morning where she felt weak almost as if she would lose consciousness and tells me recently her blood pressures being greater than 140 systolic at home in the setting of a sinus infection and taking over-the-counter Tylenol Sinus preparation    History of Present Illness:    Melanie Hampton is a 79 y.o. female with a hx of  hypertension, hyperlipidemia and PSVT  last seen 11/30/2019. She is seen today at her request for labile hypertension Compliance with diet, lifestyle and medications: yes  Recently she has had sinus pain and has  been on antibiotic and has not felt well blood pressure at times is greater than 140 systolic this morning she had an episode of weakness felt like she might faint and prompted her to call the office to be worked in today repeat blood pressure by me back and feet supported sitting 138/80.  She has had no chest pain shortness of breath or palpitation.  On the recheck labs today including electrolytes CBC ask her to stop taking over-the-counter sinus preparations and apply a 7-day monitor looking for arrhythmia especially bradycardia with her beta-blocker.  She will finish her antibiotic doxycycline Past Medical History:  Diagnosis Date  . GERD (gastroesophageal reflux disease)   . Migraine     Past Surgical History:  Procedure Laterality Date  . TONSILLECTOMY    . TUBAL LIGATION    . uterus polyp removal      Current Medications: Current Meds  Medication Sig  . aspirin EC 81 MG tablet Take 81 mg by mouth daily.  Marland Kitchen atorvastatin (LIPITOR) 10 MG tablet Take 1 tablet (10 mg total) by mouth daily.  . Cholecalciferol (VITAMIN D-3 PO) Take 1 tablet by mouth daily.  Marland Kitchen CRANBERRY PO Take 1 tablet by mouth daily.   . cyanocobalamin 1000 MCG tablet Take 1,000 mcg by mouth daily.  Marland Kitchen doxycycline (VIBRAMYCIN) 100 MG capsule Take 1 capsule by mouth 2 (two) times daily.  Marland Kitchen esomeprazole (NEXIUM) 20 MG capsule Take 20 mg by mouth daily at 12 noon.  . loratadine (CLARITIN) 10 MG tablet Take 10 mg by mouth  daily.  . Multiple Vitamins-Minerals (ZINC PO) Take 1 tablet by mouth daily.  Marland Kitchen MYRBETRIQ 50 MG TB24 tablet Take 50 mg by mouth daily.  . propranolol ER (INDERAL LA) 80 MG 24 hr capsule Take 1 capsule (80 mg total) by mouth daily.  Marland Kitchen triamterene-hydrochlorothiazide (MAXZIDE-25) 37.5-25 MG tablet Take 1 tablet by mouth daily.     Allergies:   Niacin, Penicillin g, and Amoxicillin-pot clavulanate   Social History   Socioeconomic History  . Marital status: Widowed    Spouse name: Not on file  . Number  of children: Not on file  . Years of education: Not on file  . Highest education level: Not on file  Occupational History  . Not on file  Tobacco Use  . Smoking status: Never Smoker  . Smokeless tobacco: Never Used  Substance and Sexual Activity  . Alcohol use: No  . Drug use: No  . Sexual activity: Not on file  Other Topics Concern  . Not on file  Social History Narrative  . Not on file   Social Determinants of Health   Financial Resource Strain:   . Difficulty of Paying Living Expenses: Not on file  Food Insecurity:   . Worried About Programme researcher, broadcasting/film/video in the Last Year: Not on file  . Ran Out of Food in the Last Year: Not on file  Transportation Needs:   . Lack of Transportation (Medical): Not on file  . Lack of Transportation (Non-Medical): Not on file  Physical Activity:   . Days of Exercise per Week: Not on file  . Minutes of Exercise per Session: Not on file  Stress:   . Feeling of Stress : Not on file  Social Connections:   . Frequency of Communication with Friends and Family: Not on file  . Frequency of Social Gatherings with Friends and Family: Not on file  . Attends Religious Services: Not on file  . Active Member of Clubs or Organizations: Not on file  . Attends Banker Meetings: Not on file  . Marital Status: Not on file     Family History: The patient's family history includes Cancer in her sister; Hypertension in her mother; Stroke in her father. ROS:   Please see the history of present illness.    All other systems reviewed and are negative.  EKGs/Labs/Other Studies Reviewed:    The following studies were reviewed today:  EKG:  EKG ordered today and personally reviewed.  The ekg ordered today demonstrates sinus rhythm and is normal  Recent Labs: 11/30/2019: ALT 17; BUN 17; Creatinine, Ser 0.97; Potassium 5.4; Sodium 138  Recent Lipid Panel    Component Value Date/Time   CHOL 196 11/30/2019 1113   TRIG 54 11/30/2019 1113   HDL 83  11/30/2019 1113   CHOLHDL 2.4 11/30/2019 1113   LDLCALC 103 (H) 11/30/2019 1113    Physical Exam:    VS:  BP 140/80   Pulse 79   Ht 5\' 4"  (1.626 m)   Wt 132 lb 12.8 oz (60.2 kg)   SpO2 95%   BMI 22.80 kg/m     Wt Readings from Last 3 Encounters:  01/16/20 132 lb 12.8 oz (60.2 kg)  11/30/19 133 lb 6.4 oz (60.5 kg)  11/22/18 143 lb (64.9 kg)     GEN:  Well nourished, well developed in no acute distress HEENT: Normal NECK: No JVD; No carotid bruits LYMPHATICS: No lymphadenopathy CARDIAC: RRR, no murmurs, rubs, gallops RESPIRATORY:  Clear to auscultation  without rales, wheezing or rhonchi  ABDOMEN: Soft, non-tender, non-distended MUSCULOSKELETAL:  No edema; No deformity  SKIN: Warm and dry NEUROLOGIC:  Alert and oriented x 3 PSYCHIATRIC:  Normal affect    Signed, Shirlee More, MD  01/16/2020 3:06 PM    [facility]

## 2020-01-16 NOTE — Telephone Encounter (Signed)
New Message  Pt c/o BP issue: STAT if pt c/o blurred vision, one-sided weakness or slurred speech  1. What are your last 5 BP readings? Only 149/89; 62; 147/92; 62; 126/90; 64; 155/91; 82   2. Are you having any other symptoms (ex. Dizziness, headache, blurred vision, passed out)? Headache, left arm felt tingly, lightheadness  3. What is your BP issue? Higher than normal BP

## 2020-01-16 NOTE — Telephone Encounter (Signed)
Spoke with patient. States blood pressure running higher than normal and has headache this morning but feels better now. States is being treated for a sinus infection but would like to be seen. Pt is a Dr Dulce Sellar pt but he is full . Put on as a same day add on to Dr Servando Salina schedule. At 2:55.

## 2020-01-16 NOTE — Patient Instructions (Addendum)
Medication Instructions:  Do not take Tylenol sinus over the counter   *If you need a refill on your cardiac medications before your next appointment, please call your pharmacy*  Lab Work: Cbc & Cmp   If you have labs (blood work) drawn today and your tests are completely normal, you will receive your results only by: Marland Kitchen MyChart Message (if you have MyChart) OR . A paper copy in the mail If you have any lab test that is abnormal or we need to change your treatment, we will call you to review the results.  Testing/Procedures: A zio monitor was ordered today. It will remain on for 7 days. You will then return monitor and event diary in provided box. It takes 1-2 weeks for report to be downloaded and returned to [place]. We will call you with the results. If monitor falls off or has orange flashing light, please call Zio for further instructions.     Follow-Up: At [facility], you and your health needs are our priority.  As part of our continuing mission to provide you with exceptional heart care, we have created designated Provider Care Teams.  These Care Teams include your primary Cardiologist (physician) and Advanced Practice Providers (APPs -  Physician Assistants and Nurse Practitioners) who all work together to provide you with the care you need, when you need it.  Your next appointment:   6 week(s)  The format for your next appointment:   In Person  Provider:   Norman Herrlich, MD  Other Instructions None

## 2020-01-17 LAB — COMPREHENSIVE METABOLIC PANEL
ALT: 14 IU/L (ref 0–32)
AST: 19 IU/L (ref 0–40)
Albumin/Globulin Ratio: 2.5 — ABNORMAL HIGH (ref 1.2–2.2)
Albumin: 4.9 g/dL — ABNORMAL HIGH (ref 3.7–4.7)
Alkaline Phosphatase: 98 IU/L (ref 39–117)
BUN/Creatinine Ratio: 19 (ref 12–28)
BUN: 19 mg/dL (ref 8–27)
Bilirubin Total: 0.5 mg/dL (ref 0.0–1.2)
CO2: 26 mmol/L (ref 20–29)
Calcium: 10.8 mg/dL — ABNORMAL HIGH (ref 8.7–10.3)
Chloride: 103 mmol/L (ref 96–106)
Creatinine, Ser: 0.99 mg/dL (ref 0.57–1.00)
GFR calc Af Amer: 62 mL/min/{1.73_m2} (ref >59)
GFR calc non Af Amer: 54 mL/min/{1.73_m2} — ABNORMAL LOW (ref >59)
Globulin, Total: 2 g/dL (ref 1.5–4.5)
Glucose: 85 mg/dL (ref 65–99)
Potassium: 5 mmol/L (ref 3.5–5.2)
Sodium: 142 mmol/L (ref 134–144)
Total Protein: 6.9 g/dL (ref 6.0–8.5)

## 2020-01-17 LAB — CBC
Hematocrit: 43.8 % (ref 34.0–46.6)
Hemoglobin: 14.8 g/dL (ref 11.1–15.9)
MCH: 30.9 pg (ref 26.6–33.0)
MCHC: 33.8 g/dL (ref 31.5–35.7)
MCV: 91 fL (ref 79–97)
Platelets: 264 10*3/uL (ref 150–450)
RBC: 4.79 x10E6/uL (ref 3.77–5.28)
RDW: 12.2 % (ref 11.7–15.4)
WBC: 5.8 10*3/uL (ref 3.4–10.8)

## 2020-01-18 ENCOUNTER — Telehealth: Payer: Self-pay | Admitting: Cardiology

## 2020-01-18 NOTE — Telephone Encounter (Signed)
We are recommending the COVID-19 vaccine to all of our patients. Cardiac medications (including blood thinners) should not deter anyone from being vaccinated and there is no need to hold any of those medications prior to vaccine administration.     Currently, there is a hotline to call (active 01/05/20) to schedule vaccination appointments as no walk-ins will be accepted.   Number: [phone].    If an appointment is not available please go to [email or web address] to sign up for notification when additional vaccine appointments are available.   If you have further questions or concerns about the vaccine process, please visit [email or web address] or contact your primary care physician.   

## 2020-02-23 ENCOUNTER — Encounter: Payer: Self-pay | Admitting: Cardiology

## 2020-02-23 ENCOUNTER — Other Ambulatory Visit: Payer: Self-pay

## 2020-02-23 ENCOUNTER — Ambulatory Visit (INDEPENDENT_AMBULATORY_CARE_PROVIDER_SITE_OTHER): Payer: Medicare PPO | Admitting: Cardiology

## 2020-02-23 VITALS — BP 130/72 | HR 53 | Temp 97.8°F | Ht 64.0 in | Wt 134.0 lb

## 2020-02-23 DIAGNOSIS — I471 Supraventricular tachycardia: Secondary | ICD-10-CM

## 2020-02-23 DIAGNOSIS — E7849 Other hyperlipidemia: Secondary | ICD-10-CM

## 2020-02-23 DIAGNOSIS — R55 Syncope and collapse: Secondary | ICD-10-CM | POA: Diagnosis not present

## 2020-02-23 DIAGNOSIS — I10 Essential (primary) hypertension: Secondary | ICD-10-CM

## 2020-02-23 NOTE — Progress Notes (Signed)
Cardiology Office Note:    Date:  02/23/2020   ID:  Melanie Hampton, DOB 02-14-1941, MRN [id number]  PCP:  [facility]  Cardiologist:  Norman Herrlich, MD    Referring MD: [facility]    ASSESSMENT:    1. Near syncope   2. Benign essential hypertension   3. Familial combined hyperlipidemia   4. Paroxysmal supraventricular tachycardia (HCC)    PLAN:    In order of problems listed above:  1. Her episode does not appear to be on the basis of recurrent SVT or bradycardia I encouraged her to purchase a device is described to try to document at least heart rate or arrhythmia with episodes in the future. 2. Blood pressure target continue current treatment including propranolol 3. Stable continue high intensity statin 4. Stable continue beta-blocker no recurrence   Next appointment: 6 months   Medication Adjustments/Labs and Tests Ordered: Current medicines are reviewed at length with the patient today.  Concerns regarding medicines are outlined above.  No orders of the defined types were placed in this encounter.  No orders of the defined types were placed in this encounter.   No chief complaint on file.   History of Present Illness:    Melanie Hampton is a 79 y.o. female with a hx of hypertension, hyperlipidemia and PSVT last seen 01/06/2020 after an episode of near syncope. Compliance with diet, lifestyle and medications: Yes  I reviewed the results of her monitor with her.  She has had no further episode of palpitation no edema chest pain or syncope.  I advised her to either purchase a pulse meter or adapter for smart phone to document heart rate or rhythm with symptoms in the future.  Zio monitor: Study Highlights An extended ZIO monitor was performed for 7 days and 3 hours beginning 01/16/2020 to assess palpitation. Cardiac rhythm throughout was sinus with minimum average and maximum heart rates of 48, 79 and 161 bpm.  Branch block was present  throughout. There were no pauses of 3 seconds or greater and no episodes of AV node or sinus node block. Supraventricular ectopy was rare with APCs and 1-11 beat run of atrial premature contractions at a rate of 157 bpm paroxysmal atrial tachycardia.  No episodes of atrial fibrillation or flutter. Ventricular ectopy was occasional with predominant single PVCs and 3 couplets.  The longest episode of bigeminy was 23.7 seconds and trigeminy 3 minutes and 42 seconds.  There were no episodes of ventricular tachycardia. There were 13 diary events 8 of them associated with frequent PVCs and often bigeminy. 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   Past Medical History:  Diagnosis Date  . GERD (gastroesophageal reflux disease)   . Migraine     Past Surgical History:  Procedure Laterality Date  . TONSILLECTOMY    . TUBAL LIGATION    . uterus polyp removal      Current Medications: Current Meds  Medication Sig  . aspirin EC 81 MG tablet Take 81 mg by mouth daily.     Allergies:   Niacin, Penicillin g, and Amoxicillin-pot clavulanate   Social History   Socioeconomic History  . Marital status: Widowed    Spouse name: Not on file  . Number of children: Not on file  . Years of education: Not on file  . Highest education level: Not on file  Occupational History  . Not  on file  Tobacco Use  . Smoking status: Never Smoker  . Smokeless tobacco: Never Used  Substance and Sexual Activity  . Alcohol use: No  . Drug use: No  . Sexual activity: Not Currently  Other Topics Concern  . Not on file  Social History Narrative  . Not on file   Social Determinants of Health   Financial Resource Strain:   . Difficulty of Paying Living Expenses: Not on file  Food Insecurity:   . Worried About Charity fundraiser in the Last Year: Not on file  . Ran Out of Food in the Last Year:  Not on file  Transportation Needs:   . Lack of Transportation (Medical): Not on file  . Lack of Transportation (Non-Medical): Not on file  Physical Activity:   . Days of Exercise per Week: Not on file  . Minutes of Exercise per Session: Not on file  Stress:   . Feeling of Stress : Not on file  Social Connections:   . Frequency of Communication with Friends and Family: Not on file  . Frequency of Social Gatherings with Friends and Family: Not on file  . Attends Religious Services: Not on file  . Active Member of Clubs or Organizations: Not on file  . Attends Archivist Meetings: Not on file  . Marital Status: Not on file     Family History: The patient's family history includes Cancer in her sister; Hypertension in her mother; Stroke in her father. ROS:   Please see the history of present illness.    All other systems reviewed and are negative.  EKGs/Labs/Other Studies Reviewed:    The following studies were reviewed today:    Recent Labs: Show normal renal function hemoglobin and potassium. 01/16/2020: ALT 14; BUN 19; Creatinine, Ser 0.99; Hemoglobin 14.8; Platelets 264; Potassium 5.0; Sodium 142  Recent Lipid Panel    Component Value Date/Time   CHOL 196 11/30/2019 1113   TRIG 54 11/30/2019 1113   HDL 83 11/30/2019 1113   CHOLHDL 2.4 11/30/2019 1113   LDLCALC 103 (H) 11/30/2019 1113    Physical Exam:    VS:  BP 130/72   Pulse (!) 53   Temp 97.8 F (36.6 C)   Ht 5\' 4"  (1.626 m)   Wt 134 lb (60.8 kg)   LMP  (LMP Unknown)   SpO2 99%   BMI 23.00 kg/m     Wt Readings from Last 3 Encounters:  02/23/20 134 lb (60.8 kg)  01/16/20 132 lb 12.8 oz (60.2 kg)  11/30/19 133 lb 6.4 oz (60.5 kg)     GEN:  Well nourished, well developed in no acute distress HEENT: Normal NECK: No JVD; No carotid bruits LYMPHATICS: No lymphadenopathy CARDIAC: RRR, no murmurs, rubs, gallops RESPIRATORY:  Clear to auscultation without rales, wheezing or rhonchi  ABDOMEN: Soft,  non-tender, non-distended MUSCULOSKELETAL:  No edema; No deformity  SKIN: Warm and dry NEUROLOGIC:  Alert and oriented x 3 PSYCHIATRIC:  Normal affect    Signed, Shirlee More, MD  02/23/2020 5:23 PM    [facility]

## 2020-02-23 NOTE — Patient Instructions (Signed)
Medication Instructions:  Your physician recommends that you continue on your current medications as directed. Please refer to the Current Medication list given to you today.  *If you need a refill on your cardiac medications before your next appointment, please call your pharmacy*   Lab Work: None If you have labs (blood work) drawn today and your tests are completely normal, you will receive your results only by: Marland Kitchen MyChart Message (if you have MyChart) OR . A paper copy in the mail If you have any lab test that is abnormal or we need to change your treatment, we will call you to review the results.   Testing/Procedures: None   Follow-Up: At [facility], you and your health needs are our priority.  As part of our continuing mission to provide you with exceptional heart care, we have created designated Provider Care Teams.  These Care Teams include your primary Cardiologist (physician) and Advanced Practice Providers (APPs -  Physician Assistants and Nurse Practitioners) who all work together to provide you with the care you need, when you need it.  We recommend signing up for the patient portal called "MyChart".  Sign up information is provided on this After Visit Summary.  MyChart is used to connect with patients for Virtual Visits (Telemedicine).  Patients are able to view lab/test results, encounter notes, upcoming appointments, etc.  Non-urgent messages can be sent to your provider as well.   To learn more about what you can do with MyChart, go to [email or web address].    Your next appointment:   6 month(s)  The format for your next appointment:   In Person  Provider:   Norman Herrlich, MD   Other Instructions Purchase Lourena Simmonds to record heart beat or an oxygen meter to record heart rate and oxygen saturations. Oxygen meter can be bought at [employer] or almost any pharmacy/medical store.

## 2020-06-04 DIAGNOSIS — N6459 Other signs and symptoms in breast: Secondary | ICD-10-CM | POA: Insufficient documentation

## 2020-06-04 DIAGNOSIS — N6452 Nipple discharge: Secondary | ICD-10-CM | POA: Insufficient documentation

## 2020-06-04 HISTORY — DX: Other signs and symptoms in breast: N64.59

## 2020-06-04 HISTORY — DX: Nipple discharge: N64.52

## 2020-06-07 ENCOUNTER — Other Ambulatory Visit: Payer: Self-pay | Admitting: Physician Assistant

## 2020-06-07 DIAGNOSIS — N6452 Nipple discharge: Secondary | ICD-10-CM

## 2020-06-19 ENCOUNTER — Ambulatory Visit
Admission: RE | Admit: 2020-06-19 | Discharge: 2020-06-19 | Disposition: A | Discharge status: Home / Self Care | Payer: Medicare PPO | Source: Ambulatory Visit | Attending: Physician Assistant | Admitting: Physician Assistant

## 2020-06-19 ENCOUNTER — Other Ambulatory Visit: Payer: Self-pay

## 2020-06-19 DIAGNOSIS — N6452 Nipple discharge: Secondary | ICD-10-CM

## 2020-06-19 MED ORDER — GADOBUTROL 1 MMOL/ML IV SOLN
5.0000 mL | Freq: Once | INTRAVENOUS | Status: AC | PRN
  Administered 2020-06-19: 5 mL via INTRAVENOUS

## 2020-08-23 DIAGNOSIS — K219 Gastro-esophageal reflux disease without esophagitis: Secondary | ICD-10-CM | POA: Insufficient documentation

## 2020-08-23 DIAGNOSIS — G43909 Migraine, unspecified, not intractable, without status migrainosus: Secondary | ICD-10-CM | POA: Insufficient documentation

## 2020-08-25 NOTE — Progress Notes (Signed)
Cardiology Office Note:    Date:  08/26/2020   ID:  Melanie Hampton, DOB 1941/06/17, MRN [id number]  PCP:  [facility]  Cardiologist:  Norman Herrlich, MD    Referring MD: [facility]    ASSESSMENT:    1. PVC's (premature ventricular contractions)   2. Benign essential hypertension   3. Familial combined hyperlipidemia   4. Paroxysmal supraventricular tachycardia (HCC)   5. Fatigue, unspecified type    PLAN:    In order of problems listed above:  1. Arrhythmia is well controlled continue beta-blocker. 2. Hypertension stable BP at target continue diuretic and beta-blocker and check renal function potassium 3. Lipids at target continue current treatment check liver function lipid profile continue atorvastatin 4. May well be due to her lipophilic beta-blocker but she does not want to stop it because of previous severe migraines check thyroid CBC   Next appointment: 6 months   Medication Adjustments/Labs and Tests Ordered: Current medicines are reviewed at length with the patient today.  Concerns regarding medicines are outlined above.  Orders Placed This Encounter  Procedures  . Comprehensive metabolic panel  . Lipid panel  . CBC  . TSH   No orders of the defined types were placed in this encounter.   Chief Complaint  Patient presents with  . Follow-up    Near syncope history of SVT and symptomatic PVCs    History of Present Illness:    Melanie Hampton is a 79 y.o. female with a hx of near syncope hypertension hyperlipidemia and paroxysmal SVT.  Last seen 02/23/2020.  Her ambulatory heart rhythm monitor for 7 days showed occasional ventricular ectopy with symptomatic PVCs associated with bigeminy. Compliance with diet, lifestyle and medications: Yes  She has been on propranolol for decades for hypertension as well as migraine prophylaxis.  Presently not having palpitation no further episodes of near syncope.  She does complain of fatigue I  offered to switch her to a more hydrophilic beta-blocker and she declined.  At her request we will check labs including thyroid CBC CMP and lipids looking for correctable causes of fatigue.  No chest pain or shortness of breath.  Study Highlights An extended ZIO monitor was performed for 7 days and 3 hours beginning 01/16/2020 to assess palpitation.  Cardiac rhythm throughout was sinus with minimum average and maximum heart rates of 48, 79 and 161 bpm.  Branch block was present throughout.  There were no pauses of 3 seconds or greater and no episodes of AV node or sinus node block.  Supraventricular ectopy was rare with APCs and 1-11 beat run of atrial premature contractions at a rate of 157 bpm paroxysmal atrial tachycardia.  No episodes of atrial fibrillation or flutter.  Ventricular ectopy was occasional with predominant single PVCs and 3 couplets.  The longest episode of bigeminy was 23.7 seconds and trigeminy 3 minutes and 42 seconds.  There were no episodes of ventricular tachycardia.  There were 13 diary events 8 of them associated with frequent PVCs and often bigeminy. 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  Past Medical History:  Diagnosis Date  . Familial combined hyperlipidemia 02/07/2016  . GERD (gastroesophageal reflux disease)   . Migraine     Past Surgical History:  Procedure Laterality Date  . TONSILLECTOMY    . TUBAL LIGATION    . uterus polyp removal      Current Medications:  Current Meds  Medication Sig  . aspirin EC 81 MG tablet Take 81 mg by mouth daily.  Marland Kitchen atorvastatin (LIPITOR) 10 MG tablet Take 1 tablet (10 mg total) by mouth daily.  . Cholecalciferol (VITAMIN D-3 PO) Take 1 tablet by mouth daily.  Marland Kitchen CRANBERRY PO Take 1 tablet by mouth daily.   . Cyanocobalamin (VITAMIN B-12 PO) Take 2,500 mg by mouth daily.  Marland Kitchen esomeprazole (NEXIUM) 20  MG capsule Take 20 mg by mouth daily at 12 noon.  Marland Kitchen MYRBETRIQ 50 MG TB24 tablet Take 50 mg by mouth daily.  . propranolol ER (INDERAL LA) 80 MG 24 hr capsule Take 1 capsule (80 mg total) by mouth daily.  . solifenacin (VESICARE) 5 MG tablet Take 5 mg by mouth daily.  Marland Kitchen triamterene-hydrochlorothiazide (MAXZIDE-25) 37.5-25 MG tablet Take 1 tablet by mouth daily.  Marland Kitchen trimethoprim (TRIMPEX) 100 MG tablet Take 100 mg by mouth daily.  . Zinc 50 MG TABS Take 50 mg by mouth daily.     Allergies:   Niacin, Penicillin g, and Amoxicillin-pot clavulanate   Social History   Socioeconomic History  . Marital status: Widowed    Spouse name: Not on file  . Number of children: Not on file  . Years of education: Not on file  . Highest education level: Not on file  Occupational History  . Not on file  Tobacco Use  . Smoking status: Never Smoker  . Smokeless tobacco: Never Used  Vaping Use  . Vaping Use: Never used  Substance and Sexual Activity  . Alcohol use: No  . Drug use: No  . Sexual activity: Not Currently  Other Topics Concern  . Not on file  Social History Narrative  . Not on file   Social Determinants of Health   Financial Resource Strain:   . Difficulty of Paying Living Expenses: Not on file  Food Insecurity:   . Worried About Programme researcher, broadcasting/film/video in the Last Year: Not on file  . Ran Out of Food in the Last Year: Not on file  Transportation Needs:   . Lack of Transportation (Medical): Not on file  . Lack of Transportation (Non-Medical): Not on file  Physical Activity:   . Days of Exercise per Week: Not on file  . Minutes of Exercise per Session: Not on file  Stress:   . Feeling of Stress : Not on file  Social Connections:   . Frequency of Communication with Friends and Family: Not on file  . Frequency of Social Gatherings with Friends and Family: Not on file  . Attends Religious Services: Not on file  . Active Member of Clubs or Organizations: Not on file  . Attends Tax inspector Meetings: Not on file  . Marital Status: Not on file     Family History: The patient's family history includes Cancer in her sister; Hypertension in her mother; Stroke in her father. ROS:   Please see the history of present illness.    All other systems reviewed and are negative.  EKGs/Labs/Other Studies Reviewed:    The following studies were reviewed today:    Recent Labs: 01/16/2020: ALT 14; BUN 19; Creatinine, Ser 0.99; Hemoglobin 14.8; Platelets 264; Potassium 5.0; Sodium 142  Recent Lipid Panel    Component Value Date/Time   CHOL 196 11/30/2019 1113   TRIG 54 11/30/2019 1113   HDL 83 11/30/2019 1113   CHOLHDL 2.4 11/30/2019 1113   LDLCALC 103 (H) 11/30/2019 1113    Physical Exam:  VS:  BP 130/72   Pulse 62   Ht 5\' 4"  (1.626 m)   Wt 135 lb 9.6 oz (61.5 kg)   LMP  (LMP Unknown)   SpO2 96%   BMI 23.28 kg/m     Wt Readings from Last 3 Encounters:  08/26/20 135 lb 9.6 oz (61.5 kg)  02/23/20 134 lb (60.8 kg)  01/16/20 132 lb 12.8 oz (60.2 kg)     GEN:  Well nourished, well developed in no acute distress HEENT: Normal NECK: No JVD; No carotid bruits LYMPHATICS: No lymphadenopathy CARDIAC: RRR, no murmurs, rubs, gallops RESPIRATORY:  Clear to auscultation without rales, wheezing or rhonchi  ABDOMEN: Soft, non-tender, non-distended MUSCULOSKELETAL:  No edema; No deformity  SKIN: Warm and dry NEUROLOGIC:  Alert and oriented x 3 PSYCHIATRIC:  Normal affect    Signed, 01/18/20, MD  08/26/2020 11:52 AM    [facility]

## 2020-08-26 ENCOUNTER — Other Ambulatory Visit: Payer: Self-pay

## 2020-08-26 ENCOUNTER — Encounter: Payer: Self-pay | Admitting: Cardiology

## 2020-08-26 ENCOUNTER — Ambulatory Visit: Payer: Medicare PPO | Admitting: Cardiology

## 2020-08-26 VITALS — BP 130/72 | HR 62 | Ht 64.0 in | Wt 135.6 lb

## 2020-08-26 DIAGNOSIS — I493 Ventricular premature depolarization: Secondary | ICD-10-CM

## 2020-08-26 DIAGNOSIS — E7849 Other hyperlipidemia: Secondary | ICD-10-CM

## 2020-08-26 DIAGNOSIS — I10 Essential (primary) hypertension: Secondary | ICD-10-CM

## 2020-08-26 DIAGNOSIS — I471 Supraventricular tachycardia, unspecified: Secondary | ICD-10-CM

## 2020-08-26 DIAGNOSIS — R5383 Other fatigue: Secondary | ICD-10-CM

## 2020-08-26 NOTE — Patient Instructions (Signed)
Medication Instructions:  Your physician recommends that you continue on your current medications as directed. Please refer to the Current Medication list given to you today.  *If you need a refill on your cardiac medications before your next appointment, please call your pharmacy*   Lab Work: Your physician recommends that you return for lab work in: TODAY CMP, CBC, Lipids, TSH If you have labs (blood work) drawn today and your tests are completely normal, you will receive your results only by: Marland Kitchen MyChart Message (if you have MyChart) OR . A paper copy in the mail If you have any lab test that is abnormal or we need to change your treatment, we will call you to review the results.   Testing/Procedures: None   Follow-Up: At [facility], you and your health needs are our priority.  As part of our continuing mission to provide you with exceptional heart care, we have created designated Provider Care Teams.  These Care Teams include your primary Cardiologist (physician) and Advanced Practice Providers (APPs -  Physician Assistants and Nurse Practitioners) who all work together to provide you with the care you need, when you need it.  We recommend signing up for the patient portal called "MyChart".  Sign up information is provided on this After Visit Summary.  MyChart is used to connect with patients for Virtual Visits (Telemedicine).  Patients are able to view lab/test results, encounter notes, upcoming appointments, etc.  Non-urgent messages can be sent to your provider as well.   To learn more about what you can do with MyChart, go to [email or web address].    Your next appointment:   6 month(s)  The format for your next appointment:   In Person  Provider:   Norman Herrlich, MD   Other Instructions

## 2020-08-27 LAB — COMPREHENSIVE METABOLIC PANEL
ALT: 12 IU/L (ref 0–32)
AST: 22 IU/L (ref 0–40)
Albumin/Globulin Ratio: 2.1 (ref 1.2–2.2)
Albumin: 4.6 g/dL (ref 3.6–4.6)
Alkaline Phosphatase: 101 IU/L (ref 48–121)
BUN/Creatinine Ratio: 17 (ref 12–28)
BUN: 17 mg/dL (ref 8–27)
Bilirubin Total: 0.4 mg/dL (ref 0.0–1.2)
CO2: 28 mmol/L (ref 20–29)
Calcium: 10.4 mg/dL — ABNORMAL HIGH (ref 8.7–10.3)
Chloride: 102 mmol/L (ref 96–106)
Creatinine, Ser: 1.01 mg/dL — ABNORMAL HIGH (ref 0.57–1.00)
GFR calc Af Amer: 60 mL/min/{1.73_m2} (ref >59)
GFR calc non Af Amer: 52 mL/min/{1.73_m2} — ABNORMAL LOW (ref >59)
Globulin, Total: 2.2 g/dL (ref 1.5–4.5)
Glucose: 93 mg/dL (ref 65–99)
Potassium: 5 mmol/L (ref 3.5–5.2)
Sodium: 141 mmol/L (ref 134–144)
Total Protein: 6.8 g/dL (ref 6.0–8.5)

## 2020-08-27 LAB — LIPID PANEL
Chol/HDL Ratio: 2.4 ratio (ref 0.0–4.4)
Cholesterol, Total: 196 mg/dL (ref 100–199)
HDL: 81 mg/dL (ref >39)
LDL Chol Calc (NIH): 103 mg/dL — ABNORMAL HIGH (ref 0–99)
Triglycerides: 67 mg/dL (ref 0–149)
VLDL Cholesterol Cal: 12 mg/dL (ref 5–40)

## 2020-08-27 LAB — CBC
Hematocrit: 42.2 % (ref 34.0–46.6)
Hemoglobin: 13.9 g/dL (ref 11.1–15.9)
MCH: 30.4 pg (ref 26.6–33.0)
MCHC: 32.9 g/dL (ref 31.5–35.7)
MCV: 92 fL (ref 79–97)
Platelets: 239 10*3/uL (ref 150–450)
RBC: 4.57 x10E6/uL (ref 3.77–5.28)
RDW: 12 % (ref 11.7–15.4)
WBC: 4.2 10*3/uL (ref 3.4–10.8)

## 2020-08-27 LAB — TSH: TSH: 4.13 u[IU]/mL (ref 0.450–4.500)

## 2020-08-28 ENCOUNTER — Telehealth: Payer: Self-pay | Admitting: Cardiology

## 2020-08-28 NOTE — Telephone Encounter (Signed)
Spoke with patient regarding results and recommendation.  Patient verbalizes understanding and is agreeable to plan of care. Advised patient to call back with any issues or concerns.  

## 2020-08-28 NOTE — Telephone Encounter (Signed)
Melanie Hampton is returning Shonda's call in regards to her lab results. Please advise.

## 2020-10-06 ENCOUNTER — Telehealth: Payer: Self-pay | Admitting: Physician Assistant

## 2020-10-06 NOTE — Telephone Encounter (Signed)
Patient has been having some elevated SBP in the 150s for the past few days. She also describe arm tingling, but no weakness. I recommended taking extra dose of propranolol as her HR was in the 90s. She can call our office tomorrow to see if she can schedule a earlier appt. Patient has cataract surgery on Tue which is a very low risk procedure. She is aware she can take a extra dose of propranolol each day if her SBP is > 150 mmHg.

## 2020-10-07 ENCOUNTER — Telehealth: Payer: Self-pay | Admitting: Cardiology

## 2020-10-07 NOTE — Telephone Encounter (Signed)
Patient informed of Dr. Hulen Shouts recommendation.

## 2020-10-07 NOTE — Telephone Encounter (Signed)
Called patient. She wants to know if Dr. Dulce Sellar wants her to hold anything for her cataract surgery tomorrow.   Also yesterday her blood pressure was higher than normal for her and she felt lightheaded. Her normal is around 120/70. Yesterday her blood pressures were 139-93, 150/101, and 137/89. She called her pcp yesterday and they told her to take a extra propanolol 80 mg. She did yesterday and it seemed to help. Her blood pressure today is 119/69 heart rate is in the 60s. She wants to know if Dr. Dulce Sellar thinks she should wait until later in the day to take her normal 80 mg of propanolol dose since she took extra yesterday. Will consult with Dr. Dulce Sellar.

## 2020-10-07 NOTE — Telephone Encounter (Signed)
Patient called and wanted to know if she should withhold any medication. She is having cataract surgery tomorrow. Also blood pressure has been high and PCP told her to take an extra pill and to call doctor. Please call to discuss with pt.

## 2020-10-07 NOTE — Telephone Encounter (Signed)
For cataract I would not hold any medications

## 2020-11-04 ENCOUNTER — Other Ambulatory Visit: Payer: Self-pay | Admitting: Physician Assistant

## 2020-11-04 DIAGNOSIS — Z1231 Encounter for screening mammogram for malignant neoplasm of breast: Secondary | ICD-10-CM

## 2020-11-26 ENCOUNTER — Other Ambulatory Visit: Payer: Self-pay

## 2020-11-26 ENCOUNTER — Ambulatory Visit
Admission: RE | Admit: 2020-11-26 | Discharge: 2020-11-26 | Disposition: A | Discharge status: Home / Self Care | Payer: Medicare PPO | Source: Ambulatory Visit | Attending: Physician Assistant | Admitting: Physician Assistant

## 2020-11-26 DIAGNOSIS — Z1231 Encounter for screening mammogram for malignant neoplasm of breast: Secondary | ICD-10-CM

## 2020-12-01 NOTE — Progress Notes (Signed)
Cardiology Office Note:    Date:  12/02/2020   ID:  Melanie Hampton, DOB 12/27/1941, MRN [id number]  PCP:  [facility]  Cardiologist:  Norman Herrlich, MD    Referring MD: [facility]    ASSESSMENT:    1. Paroxysmal supraventricular tachycardia (HCC)   2. PVC's (premature ventricular contractions)   3. Benign essential hypertension   4. Familial combined hyperlipidemia    PLAN:    In order of problems listed above:  1. She is improved no recurrent atrial arrhythmia not having symptomatic PVCs continue her beta-blocker.  She prefers propranolol that she is taken long-term although I have offered her selective hydrophilic beta-blockers 2. BP at target continue current regimen with mixed proximal and distal diuretic and beta-blocker 3. Stable tolerates her statin without muscle pain or weakness lipids are at target   Next appointment: 6 months   Medication Adjustments/Labs and Tests Ordered: Current medicines are reviewed at length with the patient today.  Concerns regarding medicines are outlined above.  No orders of the defined types were placed in this encounter.  No orders of the defined types were placed in this encounter.   Chief Complaint  Patient presents with  . Follow-up    SVT  . Hypertension  . Hyperlipidemia    History of Present Illness:    Melanie Hampton is a 79 y.o. female with a hx of hypertension hyperlipidemia and paroxysmal SVT.  She was last seen 08/26/2020 for near syncope and extended ambulatory event monitor for 1 week showed occasional ventricular ectopy symptomatic PVCs with bigeminy but no bradycardia arrhythmia or sustained arrhythmia.  Compliance with diet, lifestyle and medications: Yes  She is improved blood pressure heart regimen stable at home and has not had recurrent symptomatic palpitation.  Her monitor showed symptomatic PVCs. No chest pain palpitation shortness of breath edema or syncope. Past Medical  History:  Diagnosis Date  . Familial combined hyperlipidemia 02/07/2016  . GERD (gastroesophageal reflux disease)   . Migraine     Past Surgical History:  Procedure Laterality Date  . TONSILLECTOMY    . TUBAL LIGATION    . uterus polyp removal      Current Medications: Current Meds  Medication Sig  . aspirin EC 81 MG tablet Take 81 mg by mouth daily.  Marland Kitchen atorvastatin (LIPITOR) 10 MG tablet Take 1 tablet (10 mg total) by mouth daily.  . Cholecalciferol (VITAMIN D-3 PO) Take 1 tablet by mouth daily.  Marland Kitchen CRANBERRY PO Take 1 tablet by mouth daily.   . Cyanocobalamin (VITAMIN B-12 PO) Take 2,500 mg by mouth daily.  Marland Kitchen esomeprazole (NEXIUM) 20 MG capsule Take 20 mg by mouth daily at 12 noon.  . hydroxypropyl methylcellulose / hypromellose (ISOPTO TEARS / GONIOVISC) 2.5 % ophthalmic solution 1 drop.  Marland Kitchen MYRBETRIQ 50 MG TB24 tablet Take 50 mg by mouth daily.  . polyethylene glycol powder (GLYCOLAX/MIRALAX) 17 GM/SCOOP powder daily as needed.  . propranolol ER (INDERAL LA) 80 MG 24 hr capsule Take 1 capsule (80 mg total) by mouth daily.  Marland Kitchen triamterene-hydrochlorothiazide (MAXZIDE-25) 37.5-25 MG tablet Take 1 tablet by mouth daily.  Marland Kitchen trimethoprim (TRIMPEX) 100 MG tablet Take 100 mg by mouth daily.  . Zinc 50 MG TABS Take 50 mg by mouth daily.     Allergies:   Niacin, Penicillin g, and Amoxicillin-pot clavulanate   Social History   Socioeconomic History  . Marital status: Widowed    Spouse name: Not on file  .  Number of children: Not on file  . Years of education: Not on file  . Highest education level: Not on file  Occupational History  . Not on file  Tobacco Use  . Smoking status: Never Smoker  . Smokeless tobacco: Never Used  Vaping Use  . Vaping Use: Never used  Substance and Sexual Activity  . Alcohol use: No  . Drug use: No  . Sexual activity: Not Currently  Other Topics Concern  . Not on file  Social History Narrative  . Not on file   Social Determinants of Health    Financial Resource Strain:   . Difficulty of Paying Living Expenses: Not on file  Food Insecurity:   . Worried About Programme researcher, broadcasting/film/video in the Last Year: Not on file  . Ran Out of Food in the Last Year: Not on file  Transportation Needs:   . Lack of Transportation (Medical): Not on file  . Lack of Transportation (Non-Medical): Not on file  Physical Activity:   . Days of Exercise per Week: Not on file  . Minutes of Exercise per Session: Not on file  Stress:   . Feeling of Stress : Not on file  Social Connections:   . Frequency of Communication with Friends and Family: Not on file  . Frequency of Social Gatherings with Friends and Family: Not on file  . Attends Religious Services: Not on file  . Active Member of Clubs or Organizations: Not on file  . Attends Banker Meetings: Not on file  . Marital Status: Not on file     Family History: The patient's family history includes Cancer in her sister; Hypertension in her mother; Stroke in her father. ROS:   Please see the history of present illness.    All other systems reviewed and are negative.  EKGs/Labs/Other Studies Reviewed:    The following studies were reviewed today:   Recent Labs: 08/26/2020: ALT 12; BUN 17; Creatinine, Ser 1.01; Hemoglobin 13.9; Platelets 239; Potassium 5.0; Sodium 141; TSH 4.130  Recent Lipid Panel    Component Value Date/Time   CHOL 196 08/26/2020 1202   TRIG 67 08/26/2020 1202   HDL 81 08/26/2020 1202   CHOLHDL 2.4 08/26/2020 1202   LDLCALC 103 (H) 08/26/2020 1202    Physical Exam:    VS:  BP 129/74   Pulse 64   Ht 5\' 4"  (1.626 m)   Wt 137 lb (62.1 kg)   LMP  (LMP Unknown)   SpO2 96%   BMI 23.52 kg/m     Wt Readings from Last 3 Encounters:  12/02/20 137 lb (62.1 kg)  08/26/20 135 lb 9.6 oz (61.5 kg)  02/23/20 134 lb (60.8 kg)     GEN:  Well nourished, well developed in no acute distress HEENT: Normal NECK: No JVD; No carotid bruits LYMPHATICS: No  lymphadenopathy CARDIAC: RRR, no murmurs, rubs, gallops RESPIRATORY:  Clear to auscultation without rales, wheezing or rhonchi  ABDOMEN: Soft, non-tender, non-distended MUSCULOSKELETAL:  No edema; No deformity  SKIN: Warm and dry NEUROLOGIC:  Alert and oriented x 3 PSYCHIATRIC:  Normal affect    Signed, 02/25/20, MD  12/02/2020 11:28 AM    [facility]

## 2020-12-02 ENCOUNTER — Encounter: Payer: Self-pay | Admitting: Cardiology

## 2020-12-02 ENCOUNTER — Ambulatory Visit: Payer: Medicare PPO | Admitting: Cardiology

## 2020-12-02 ENCOUNTER — Other Ambulatory Visit: Payer: Self-pay

## 2020-12-02 VITALS — BP 129/74 | HR 64 | Ht 64.0 in | Wt 137.0 lb

## 2020-12-02 DIAGNOSIS — I10 Essential (primary) hypertension: Secondary | ICD-10-CM

## 2020-12-02 DIAGNOSIS — I493 Ventricular premature depolarization: Secondary | ICD-10-CM | POA: Diagnosis not present

## 2020-12-02 DIAGNOSIS — I471 Supraventricular tachycardia: Secondary | ICD-10-CM

## 2020-12-02 DIAGNOSIS — E7849 Other hyperlipidemia: Secondary | ICD-10-CM | POA: Diagnosis not present

## 2020-12-02 NOTE — Patient Instructions (Signed)

## 2021-01-02 ENCOUNTER — Telehealth: Payer: Self-pay | Admitting: Cardiology

## 2021-01-02 NOTE — Telephone Encounter (Signed)
New message:     Patient calling stating that she went to go get her covid shot and before she was going to get it her arm was hurting in her left arm. Patient said it was hurting. Please call patient.

## 2021-01-02 NOTE — Telephone Encounter (Signed)
Spoke to the patient just now and she let me know that her arm was sore for about an hour to an hour and a half. She states that she checked her blood pressure and her heart rate and they were both fine. She does not know the exact readings. She denies any SOB, Chest pain, or dizziness.   I advised that she should keep an eye on this pain and let us know if she has any issues.   Encouraged patient to call back with any questions or concerns.

## 2021-01-03 NOTE — Telephone Encounter (Signed)
Pt c/o swelling: STAT is pt has developed SOB within 24 hours  1) How much weight have you gained and in what time span? Not sure  2) If swelling, where is the swelling located? Left leg, below knee  3) Are you currently taking a fluid pill? yes  4) Are you currently SOB? no  5) Do you have a log of your daily weights (if so, list)? Has not weighed herself  6) Have you gained 3 pounds in a day or 5 pounds in a week? Not sure  7) Have you traveled recently? no   Patient states her arm is still hurting and she has swelling in her left leg. She states she has not weighed herself, but she has been eating more. She states she wants to know if she can be seen to have her symptoms checked out. I did not see anything sooner than 02/12/2021 with Dr. Dulce Sellar.

## 2021-01-03 NOTE — Telephone Encounter (Signed)
Spoke with the patient just now and let her know Dr. Hulen Shouts recommendations and she verbalizes understanding.   Encouraged patient to call back with any questions or concerns.

## 2021-01-03 NOTE — Telephone Encounter (Signed)
Tried calling patient. No answer and no voicemail set up for me to leave a message.  Will route to Dr. Dulce Sellar to advise while I try to get in touch with her.

## 2021-01-03 NOTE — Telephone Encounter (Signed)
I looked at these notes and I think she should see her family physician first.

## 2021-01-07 ENCOUNTER — Telehealth: Payer: Self-pay | Admitting: Cardiology

## 2021-01-07 NOTE — Telephone Encounter (Signed)
Patient c/o Palpitations:  High priority if patient c/o lightheadedness, shortness of breath, or chest pain  1) How long have you had palpitations/irregular HR/ Afib? Are you having the symptoms now? Patient states its been going on for about a week and getting worse as the days go on.  2) Are you currently experiencing lightheadedness, SOB or CP? SOB and lightheadedness   3) Do you have a history of afib (atrial fibrillation) or irregular heart rhythm? Yes.  4) Have you checked your BP or HR? (document readings if available): 158/95 HR 76  5) Are you experiencing any other symptoms? Patient states she's having pain from her shoulder to her hand, headache and feeling dizzy.

## 2021-01-07 NOTE — Telephone Encounter (Signed)
Spoke with pt who is complaining of intermittent left arm pain that radiates from her shoulder to her hand and palpitations x 2 weeks and swelling in left calf x 1 week.  Pt is unsure if she has any redness or red streaks with the left calf.  Pt denies current CP or new SOB and states she has tried tylenol for the discomfort in her arm.  She is taking her other medications as prescribed.  Pt has tried to see her PCP for this problem but has been unsuccessful at obtaining an appointment.  Pt would like to get her Covid booster vaccine but was advised to see cardiology to assure she did not have a blood clot.   Pt advised will forward information to Dr Dulce Sellar and his nurse for review and recommendation.  Reviewed ED precautions.  Pt verbalizes understanding and agrees with current plan.

## 2021-01-10 ENCOUNTER — Other Ambulatory Visit: Payer: Self-pay | Admitting: Cardiology

## 2021-01-10 NOTE — Telephone Encounter (Signed)
Rx refill sent to pharmacy. 

## 2021-02-12 ENCOUNTER — Ambulatory Visit: Payer: Medicare PPO | Admitting: Cardiology

## 2021-02-12 NOTE — Progress Notes (Signed)
Cardiology Office Note:    Date:  02/13/2021   ID:  Melanie Hampton, DOB [birth year], MRN [id number]  PCP:  Leane Call, PA-C  Cardiologist:  Norman Herrlich, MD    Referring MD: [facility] [place] [facility]    ASSESSMENT:    1. Paroxysmal supraventricular tachycardia (HCC)   2. Benign essential hypertension   3. Familial combined hyperlipidemia    PLAN:    In order of problems listed above:  1. Stable no recurrence continue her beta-blocker 2. BP at target at home runs in the range of 120/7080 and repeat by me in the office sitting resting 120/80. 3. Continue her statin she is due for labs we will check CMP and a lipid profile 4. Left bundle branch block stable similar to previous EKG January 2021   Next appointment: 6 months   Medication Adjustments/Labs and Tests Ordered: Current medicines are reviewed at length with the patient today.  Concerns regarding medicines are outlined above.  Orders Placed This Encounter  Procedures  . Comprehensive metabolic panel  . Lipid panel  . EKG 12-Lead   No orders of the defined types were placed in this encounter.   Chief Complaint  Patient presents with  . Follow-up    SVT Yes yes from a cardiology perspective    History of Present Illness:    Melanie Hampton is a 80 y.o. female with a hx of hypertension hyperlipidemia and paroxysmal SVT last seen 12/02/2020. Compliance with diet, lifestyle and medications: YES From a cardiology perspective she is doing well she has had no recurrent tachyarrhythmia blood pressure is in range at home and tolerates lipid-lowering therapy without muscle pain or weakness. Home blood pressure typically in the range of 120/70-80. She has chronic left knee pain and noticed that she has a little bit of edema in the left lower extremity no muscle pain no history of venous thromboembolism.  She has had recent vertigo and is wearing a scopolamine patch that she uses as needed.  She has had some numbness and  tingling in the left hand. Past Medical History:  Diagnosis Date  . Familial combined hyperlipidemia 02/07/2016  . GERD (gastroesophageal reflux disease)   . Migraine     Past Surgical History:  Procedure Laterality Date  . TONSILLECTOMY    . TUBAL LIGATION    . uterus polyp removal      Current Medications: Current Meds  Medication Sig  . aspirin EC 81 MG tablet Take 81 mg by mouth daily.  Marland Kitchen atorvastatin (LIPITOR) 10 MG tablet Take 1 tablet by mouth once daily  . Cholecalciferol (VITAMIN D-3 PO) Take 1 tablet by mouth daily.  Marland Kitchen CRANBERRY PO Take 1 tablet by mouth daily.   . Cyanocobalamin (VITAMIN B-12 PO) Take 2,500 mg by mouth daily.  Marland Kitchen esomeprazole (NEXIUM) 20 MG capsule Take 20 mg by mouth daily at 12 noon.  . hydroxypropyl methylcellulose / hypromellose (ISOPTO TEARS / GONIOVISC) 2.5 % ophthalmic solution 1 drop.  Marland Kitchen MYRBETRIQ 50 MG TB24 tablet Take 50 mg by mouth daily.  . polyethylene glycol powder (GLYCOLAX/MIRALAX) 17 GM/SCOOP powder daily as needed.  . propranolol ER (INDERAL LA) 80 MG 24 hr capsule Take 1 capsule (80 mg total) by mouth daily.  Marland Kitchen scopolamine (TRANSDERM-SCOP) 1 MG/3DAYS Place 1 patch onto the skin every three (3) days as needed.  . triamterene-hydrochlorothiazide (MAXZIDE-25) 37.5-25 MG tablet Take 1 tablet by mouth daily.  . Zinc 50 MG TABS Take 50 mg by mouth daily.  Allergies:   Niacin, Penicillin g, and Amoxicillin-pot clavulanate   Social History   Socioeconomic History  . Marital status: Widowed    Spouse name: Not on file  . Number of children: Not on file  . Years of education: Not on file  . Highest education level: Not on file  Occupational History  . Not on file  Tobacco Use  . Smoking status: Never Smoker  . Smokeless tobacco: Never Used  Vaping Use  . Vaping Use: Never used  Substance and Sexual Activity  . Alcohol use: No  . Drug use: No  . Sexual activity: Not Currently  Other Topics Concern  . Not on file  Social  History Narrative  . Not on file   Social Determinants of Health   Financial Resource Strain: Not on file  Food Insecurity: Not on file  Transportation Needs: Not on file  Physical Activity: Not on file  Stress: Not on file  Social Connections: Not on file     Family History: The patient's family history includes Cancer in her sister; Hypertension in her mother; Stroke in her father. ROS:   Please see the history of present illness.    All other systems reviewed and are negative.  EKGs/Labs/Other Studies Reviewed:    The following studies were reviewed today:  EKG:  EKG ordered today and personally reviewed.  The ekg ordered today demonstrates sinus rhythm left bundle branch block  Recent Labs: 08/26/2020: ALT 12; BUN 17; Creatinine, Ser 1.01; Hemoglobin 13.9; Platelets 239; Potassium 5.0; Sodium 141; TSH 4.130  Recent Lipid Panel    Component Value Date/Time   CHOL 196 08/26/2020 1202   TRIG 67 08/26/2020 1202   HDL 81 08/26/2020 1202   CHOLHDL 2.4 08/26/2020 1202   LDLCALC 103 (H) 08/26/2020 1202    Physical Exam:    VS:  BP 140/88   Pulse 74   Ht 5\' 4"  (1.626 m)   Wt 141 lb 3.2 oz (64 kg)   LMP  (LMP Unknown)   SpO2 98%   BMI 24.24 kg/m     Wt Readings from Last 3 Encounters:  02/13/21 141 lb 3.2 oz (64 kg)  12/02/20 137 lb (62.1 kg)  08/26/20 135 lb 9.6 oz (61.5 kg)     GEN:  Well nourished, well developed in no acute distress HEENT: Normal NECK: No JVD; No carotid bruits LYMPHATICS: No lymphadenopathy CARDIAC: RRR, no murmurs, rubs, gallops RESPIRATORY:  Clear to auscultation without rales, wheezing or rhonchi  ABDOMEN: Soft, non-tender, non-distended MUSCULOSKELETAL:  No edema; No deformity  SKIN: Warm and dry NEUROLOGIC:  Alert and oriented x 3 PSYCHIATRIC:  Normal affect    Signed, 08/28/20, MD  02/13/2021 10:29 AM    [facility]

## 2021-02-13 ENCOUNTER — Ambulatory Visit: Payer: Medicare PPO | Admitting: Cardiology

## 2021-02-13 ENCOUNTER — Encounter: Payer: Self-pay | Admitting: Cardiology

## 2021-02-13 ENCOUNTER — Other Ambulatory Visit: Payer: Self-pay

## 2021-02-13 VITALS — BP 140/88 | HR 74 | Ht 64.0 in | Wt 141.2 lb

## 2021-02-13 DIAGNOSIS — I471 Supraventricular tachycardia, unspecified: Secondary | ICD-10-CM

## 2021-02-13 DIAGNOSIS — E7849 Other hyperlipidemia: Secondary | ICD-10-CM

## 2021-02-13 DIAGNOSIS — I10 Essential (primary) hypertension: Secondary | ICD-10-CM

## 2021-02-13 LAB — COMPREHENSIVE METABOLIC PANEL
ALT: 15 IU/L (ref 0–32)
AST: 22 IU/L (ref 0–40)
Albumin/Globulin Ratio: 2 (ref 1.2–2.2)
Albumin: 4.4 g/dL (ref 3.6–4.6)
Alkaline Phosphatase: 92 IU/L (ref 44–121)
BUN/Creatinine Ratio: 18 (ref 12–28)
BUN: 20 mg/dL (ref 8–27)
Bilirubin Total: 0.4 mg/dL (ref 0.0–1.2)
CO2: 23 mmol/L (ref 20–29)
Calcium: 10.3 mg/dL (ref 8.7–10.3)
Chloride: 100 mmol/L (ref 96–106)
Creatinine, Ser: 1.09 mg/dL — ABNORMAL HIGH (ref 0.57–1.00)
GFR calc Af Amer: 55 mL/min/{1.73_m2} — ABNORMAL LOW (ref >59)
GFR calc non Af Amer: 48 mL/min/{1.73_m2} — ABNORMAL LOW (ref >59)
Globulin, Total: 2.2 g/dL (ref 1.5–4.5)
Glucose: 95 mg/dL (ref 65–99)
Potassium: 4.9 mmol/L (ref 3.5–5.2)
Sodium: 140 mmol/L (ref 134–144)
Total Protein: 6.6 g/dL (ref 6.0–8.5)

## 2021-02-13 LAB — LIPID PANEL
Chol/HDL Ratio: 2.4 ratio (ref 0.0–4.4)
Cholesterol, Total: 198 mg/dL (ref 100–199)
HDL: 82 mg/dL (ref >39)
LDL Chol Calc (NIH): 106 mg/dL — ABNORMAL HIGH (ref 0–99)
Triglycerides: 56 mg/dL (ref 0–149)
VLDL Cholesterol Cal: 10 mg/dL (ref 5–40)

## 2021-02-13 NOTE — Patient Instructions (Signed)

## 2021-02-14 ENCOUNTER — Telehealth: Payer: Self-pay

## 2021-02-14 NOTE — Telephone Encounter (Signed)
-----   Message from Brian J Munley, MD sent at 02/14/2021  7:59 AM EST ----- Stable good results and no changes 

## 2021-02-14 NOTE — Telephone Encounter (Signed)
Patient notified of results and verbalized understanding.  

## 2021-02-14 NOTE — Telephone Encounter (Signed)
Left a message to return my call.

## 2021-02-14 NOTE — Telephone Encounter (Signed)
-----   Message from Baldo Daub, MD sent at 02/14/2021  7:59 AM EST ----- Stable good results and no changes

## 2021-03-04 ENCOUNTER — Ambulatory Visit: Payer: Medicare PPO | Admitting: Cardiology

## 2021-03-10 ENCOUNTER — Other Ambulatory Visit: Payer: Self-pay | Admitting: Cardiology

## 2021-03-10 NOTE — Telephone Encounter (Signed)
Maxzide approved and sent

## 2021-06-09 DIAGNOSIS — M629 Disorder of muscle, unspecified: Secondary | ICD-10-CM

## 2021-06-09 HISTORY — DX: Disorder of muscle, unspecified: M62.9

## 2021-06-10 ENCOUNTER — Ambulatory Visit: Payer: Medicare PPO | Admitting: Cardiology

## 2021-08-02 ENCOUNTER — Other Ambulatory Visit: Payer: Self-pay | Admitting: Cardiology

## 2021-09-30 ENCOUNTER — Ambulatory Visit: Payer: Medicare PPO | Admitting: Cardiology

## 2021-09-30 ENCOUNTER — Encounter: Payer: Self-pay | Admitting: Cardiology

## 2021-09-30 ENCOUNTER — Other Ambulatory Visit: Payer: Self-pay

## 2021-09-30 VITALS — BP 136/80 | HR 64 | Ht 64.0 in | Wt 136.2 lb

## 2021-09-30 DIAGNOSIS — I10 Essential (primary) hypertension: Secondary | ICD-10-CM | POA: Diagnosis not present

## 2021-09-30 DIAGNOSIS — I471 Supraventricular tachycardia: Secondary | ICD-10-CM

## 2021-09-30 DIAGNOSIS — E7849 Other hyperlipidemia: Secondary | ICD-10-CM | POA: Diagnosis not present

## 2021-09-30 MED ORDER — PROPRANOLOL HCL ER 80 MG PO CP24: 80.0000 mg | ORAL_CAPSULE | Freq: Every day | ORAL | 3 refills | Status: DC

## 2021-09-30 MED ORDER — TRIAMTERENE-HCTZ 37.5-25 MG PO TABS: 1.0000 | ORAL_TABLET | Freq: Every day | ORAL | 1 refills | Status: DC

## 2021-09-30 MED ORDER — ATORVASTATIN CALCIUM 10 MG PO TABS: 10.0000 mg | ORAL_TABLET | Freq: Every day | ORAL | 3 refills | Status: DC

## 2021-09-30 NOTE — Patient Instructions (Signed)
Medication Instructions:  Your physician recommends that you continue on your current medications as directed. Please refer to the Current Medication list given to you today.  *If you need a refill on your cardiac medications before your next appointment, please call your pharmacy*   Lab Work: Your physician recommends that you return for lab work in: Today for CMP and Lipid Panel  If you have labs (blood work) drawn today and your tests are completely normal, you will receive your results only by: MyChart Message (if you have MyChart) OR A paper copy in the mail If you have any lab test that is abnormal or we need to change your treatment, we will call you to review the results.   Testing/Procedures: NONE   Follow-Up: At [facility], you and your health needs are our priority.  As part of our continuing mission to provide you with exceptional heart care, we have created designated Provider Care Teams.  These Care Teams include your primary Cardiologist (physician) and Advanced Practice Providers (APPs -  Physician Assistants and Nurse Practitioners) who all work together to provide you with the care you need, when you need it.  We recommend signing up for the patient portal called "MyChart".  Sign up information is provided on this After Visit Summary.  MyChart is used to connect with patients for Virtual Visits (Telemedicine).  Patients are able to view lab/test results, encounter notes, upcoming appointments, etc.  Non-urgent messages can be sent to your provider as well.   To learn more about what you can do with MyChart, go to [email or web address].    Your next appointment:   9 month(s)  The format for your next appointment:   In Person  Provider:   Norman Herrlich, MD   Other Instructions

## 2021-09-30 NOTE — Progress Notes (Signed)
Cardiology Office Note:    Date:  09/30/2021   ID:  Melanie Hampton, DOB March 09, 1941, MRN [id number]  PCP:  Leane Call, PA-C  Cardiologist:  Norman Herrlich, MD    Referring MD: Leane Call, PA-C    ASSESSMENT:    1. Paroxysmal supraventricular tachycardia (HCC)   2. Benign essential hypertension   3. Familial combined hyperlipidemia    PLAN:    In order of problems listed above:  Melanie Hampton continues to do well no breakthrough episodes of her tachycardia continue her current beta-blocker At target continue current treatment beta-blocker and diuretic check renal function testing for safety Stable to tolerate statin continue check labs for safety lipid profile CMP   Next appointment: 9 months   Medication Adjustments/Labs and Tests Ordered: Current medicines are reviewed at length with the patient today.  Concerns regarding medicines are outlined above.  Orders Placed This Encounter  Procedures   Comprehensive metabolic panel   Lipid panel    Meds ordered this encounter  Medications   triamterene-hydrochlorothiazide (MAXZIDE-25) 37.5-25 MG tablet    Sig: Take 1 tablet by mouth daily.    Dispense:  90 tablet    Refill:  1   propranolol ER (INDERAL LA) 80 MG 24 hr capsule    Sig: Take 1 capsule (80 mg total) by mouth daily.    Dispense:  90 capsule    Refill:  3   atorvastatin (LIPITOR) 10 MG tablet    Sig: Take 1 tablet (10 mg total) by mouth daily.    Dispense:  90 tablet    Refill:  3     Chief Complaint  Patient presents with   Follow-up    With SVT hypertension and hyperlipidemia    History of Present Illness:    Melanie Hampton is a 80 y.o. female with a hx of  hypertension hyperlipidemia and paroxysmal SVT  last seen 12/17/20221. Compliance with diet, lifestyle and medications: Yes  She tracks blood pressure at home it runs between 01/17/1931 systolic 80 or less. No rapid heart rhythms No muscle pain or weakness from her statin No edema  shortness of breath chest pain palpitation or syncope Past Medical History:  Diagnosis Date   Familial combined hyperlipidemia 02/07/2016   GERD (gastroesophageal reflux disease)    Migraine     Past Surgical History:  Procedure Laterality Date   TONSILLECTOMY     TUBAL LIGATION     uterus polyp removal      Current Medications: Current Meds  Medication Sig   aspirin EC 81 MG tablet Take 81 mg by mouth daily.   Cholecalciferol (VITAMIN D-3 PO) Take 1 tablet by mouth daily.   CRANBERRY PO Take 1 tablet by mouth daily.    Cyanocobalamin (VITAMIN B-12 PO) Take 2,500 mg by mouth daily.   esomeprazole (NEXIUM) 20 MG capsule Take 20 mg by mouth daily at 12 noon.   hydroxypropyl methylcellulose / hypromellose (ISOPTO TEARS / GONIOVISC) 2.5 % ophthalmic solution 1 drop.   MYRBETRIQ 50 MG TB24 tablet Take 50 mg by mouth daily.   polyethylene glycol powder (GLYCOLAX/MIRALAX) 17 GM/SCOOP powder daily as needed.   scopolamine (TRANSDERM-SCOP) 1 MG/3DAYS Place 1 patch onto the skin every three (3) days as needed.   Zinc 50 MG TABS Take 50 mg by mouth daily.   [DISCONTINUED] atorvastatin (LIPITOR) 10 MG tablet Take 1 tablet by mouth once daily   [DISCONTINUED] propranolol ER (INDERAL LA) 80 MG 24 hr capsule Take 1 capsule (80 mg  total) by mouth daily.   [DISCONTINUED] triamterene-hydrochlorothiazide (MAXZIDE-25) 37.5-25 MG tablet Take 1 tablet by mouth once daily     Allergies:   Niacin, Penicillin g, and Amoxicillin-pot clavulanate   Social History   Socioeconomic History   Marital status: Widowed    Spouse name: Not on file   Number of children: Not on file   Years of education: Not on file   Highest education level: Not on file  Occupational History   Not on file  Tobacco Use   Smoking status: Never   Smokeless tobacco: Never  Vaping Use   Vaping Use: Never used  Substance and Sexual Activity   Alcohol use: No   Drug use: No   Sexual activity: Not Currently  Other Topics  Concern   Not on file  Social History Narrative   Not on file   Social Determinants of Health   Financial Resource Strain: Not on file  Food Insecurity: Not on file  Transportation Needs: Not on file  Physical Activity: Not on file  Stress: Not on file  Social Connections: Not on file     Family History: The patient's family history includes Cancer in her sister; Hypertension in her mother; Stroke in her father. ROS:   Please see the history of present illness.    All other systems reviewed and are negative.  EKGs/Labs/Other Studies Reviewed:    The following studies were reviewed today:    Recent Labs: 02/13/2021: ALT 15; BUN 20; Creatinine, Ser 1.09; Potassium 4.9; Sodium 140  Recent Lipid Panel    Component Value Date/Time   CHOL 198 02/13/2021 1019   TRIG 56 02/13/2021 1019   HDL 82 02/13/2021 1019   CHOLHDL 2.4 02/13/2021 1019   LDLCALC 106 (H) 02/13/2021 1019    Physical Exam:    VS:  BP (!) 156/88 (BP Location: Right Arm, Patient Position: Sitting, Cuff Size: Normal)   Pulse 64   Ht 5\' 4"  (1.626 m)   Wt 136 lb 3.2 oz (61.8 kg)   LMP  (LMP Unknown)   SpO2 96%   BMI 23.38 kg/m     Wt Readings from Last 3 Encounters:  09/30/21 136 lb 3.2 oz (61.8 kg)  02/13/21 141 lb 3.2 oz (64 kg)  12/02/20 137 lb (62.1 kg)     GEN:  Well nourished, well developed in no acute distress HEENT: Normal NECK: No JVD; No carotid bruits LYMPHATICS: No lymphadenopathy CARDIAC: RRR, no murmurs, rubs, gallops RESPIRATORY:  Clear to auscultation without rales, wheezing or rhonchi  ABDOMEN: Soft, non-tender, non-distended MUSCULOSKELETAL:  No edema; No deformity  SKIN: Warm and dry NEUROLOGIC:  Alert and oriented x 3 PSYCHIATRIC:  Normal affect    Signed, 14/06/21, MD  09/30/2021 1:06 PM    [facility]

## 2021-10-01 ENCOUNTER — Telehealth: Payer: Self-pay

## 2021-10-01 LAB — COMPREHENSIVE METABOLIC PANEL
ALT: 11 IU/L (ref 0–32)
AST: 18 IU/L (ref 0–40)
Albumin/Globulin Ratio: 2.3 — ABNORMAL HIGH (ref 1.2–2.2)
Albumin: 4.8 g/dL — ABNORMAL HIGH (ref 3.6–4.6)
Alkaline Phosphatase: 123 IU/L — ABNORMAL HIGH (ref 44–121)
BUN/Creatinine Ratio: 14 (ref 12–28)
BUN: 13 mg/dL (ref 8–27)
Bilirubin Total: 0.4 mg/dL (ref 0.0–1.2)
CO2: 25 mmol/L (ref 20–29)
Calcium: 10.6 mg/dL — ABNORMAL HIGH (ref 8.7–10.3)
Chloride: 101 mmol/L (ref 96–106)
Creatinine, Ser: 0.96 mg/dL (ref 0.57–1.00)
Globulin, Total: 2.1 g/dL (ref 1.5–4.5)
Glucose: 95 mg/dL (ref 70–99)
Potassium: 4.9 mmol/L (ref 3.5–5.2)
Sodium: 142 mmol/L (ref 134–144)
Total Protein: 6.9 g/dL (ref 6.0–8.5)
eGFR: 59 mL/min/{1.73_m2} — ABNORMAL LOW (ref >59)

## 2021-10-01 LAB — LIPID PANEL
Chol/HDL Ratio: 2.6 ratio (ref 0.0–4.4)
Cholesterol, Total: 211 mg/dL — ABNORMAL HIGH (ref 100–199)
HDL: 80 mg/dL (ref >39)
LDL Chol Calc (NIH): 119 mg/dL — ABNORMAL HIGH (ref 0–99)
Triglycerides: 70 mg/dL (ref 0–149)
VLDL Cholesterol Cal: 12 mg/dL (ref 5–40)

## 2021-10-01 NOTE — Telephone Encounter (Signed)
-----   Message from Brian J Munley, MD sent at 10/01/2021  9:57 AM EDT ----- Regarding: FW: Stable  No changes in treatment ----- Message ----- From: Interface, [facility] Lab Results In Sent: 10/01/2021   5:38 AM EDT To: Brian J Munley, MD  

## 2021-10-01 NOTE — Telephone Encounter (Signed)
Left message on patients voicemail to please return our call.   

## 2021-10-01 NOTE — Telephone Encounter (Signed)
-----   Message from Baldo Daub, MD sent at 10/01/2021  9:57 AM EDT ----- Regarding: FW: Stable  No changes in treatment ----- Message ----- From: Interface, [facility] Lab Results In Sent: 10/01/2021   5:38 AM EDT To: Baldo Daub, MD

## 2021-10-01 NOTE — Telephone Encounter (Signed)
Spoke with patient regarding results and recommendation.  Patient verbalizes understanding and is agreeable to plan of care. Advised patient to call back with any issues or concerns.  

## 2022-05-04 IMAGING — MG DIGITAL SCREENING BILAT W/ TOMO W/ CAD
8 series · 9 of 24 positions shown · non-contrast
Comparison: Previous exam(s).

CLINICAL DATA: Screening.

EXAM:
DIGITAL SCREENING BILATERAL MAMMOGRAM WITH TOMO AND CAD

[L CC synth-2D]
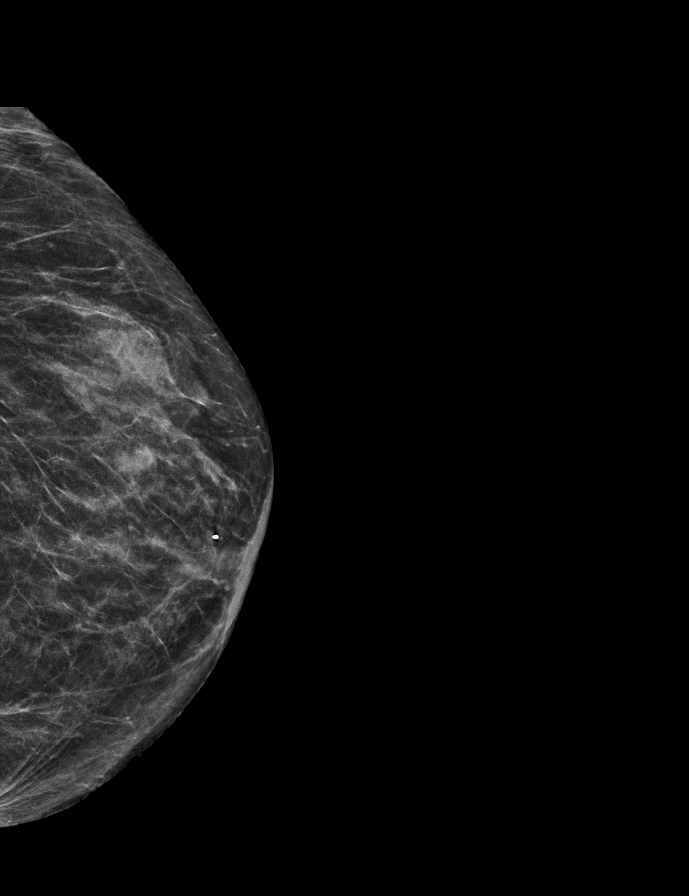

[R CC synth-2D]
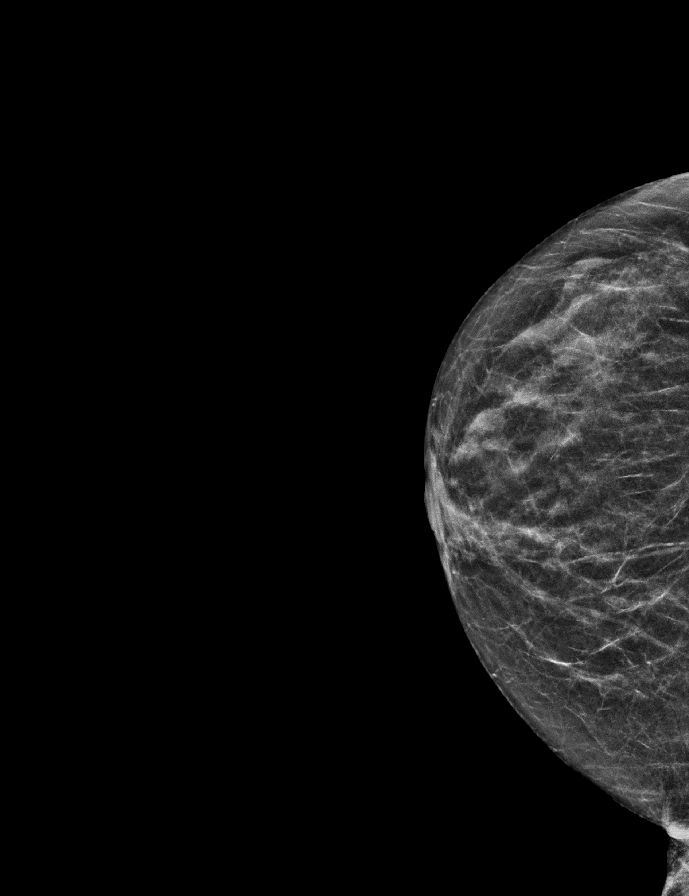

[L MLO synth-2D]
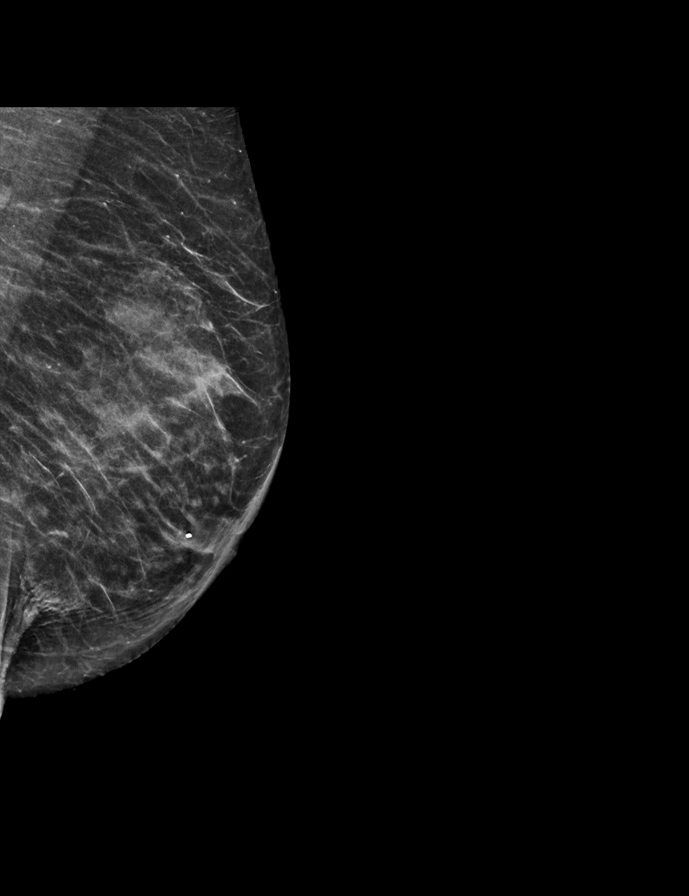

[R MLO synth-2D]
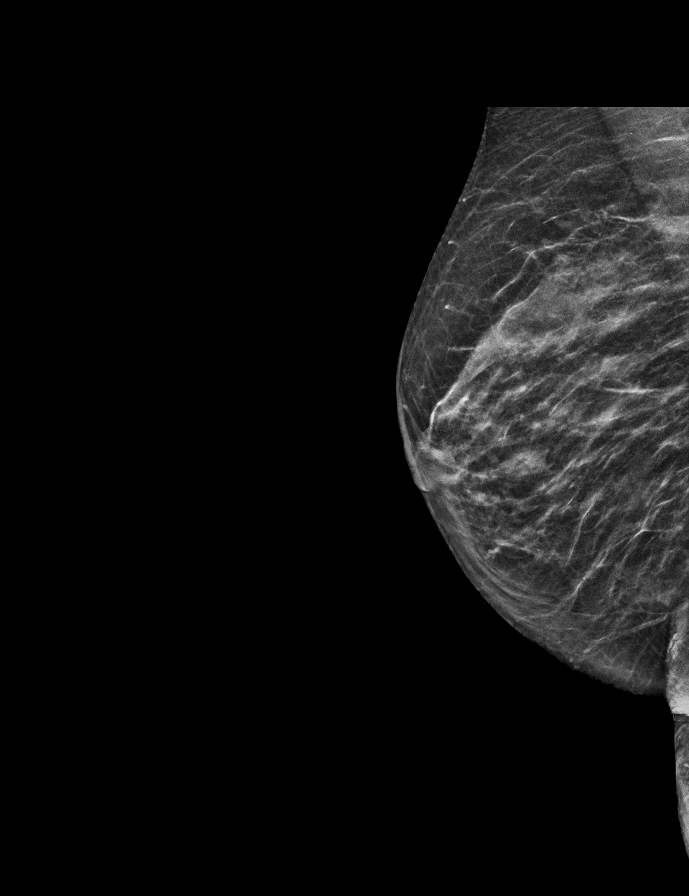

[R MLO tomo · 2 of 44 frames shown]
[frame 15/44]
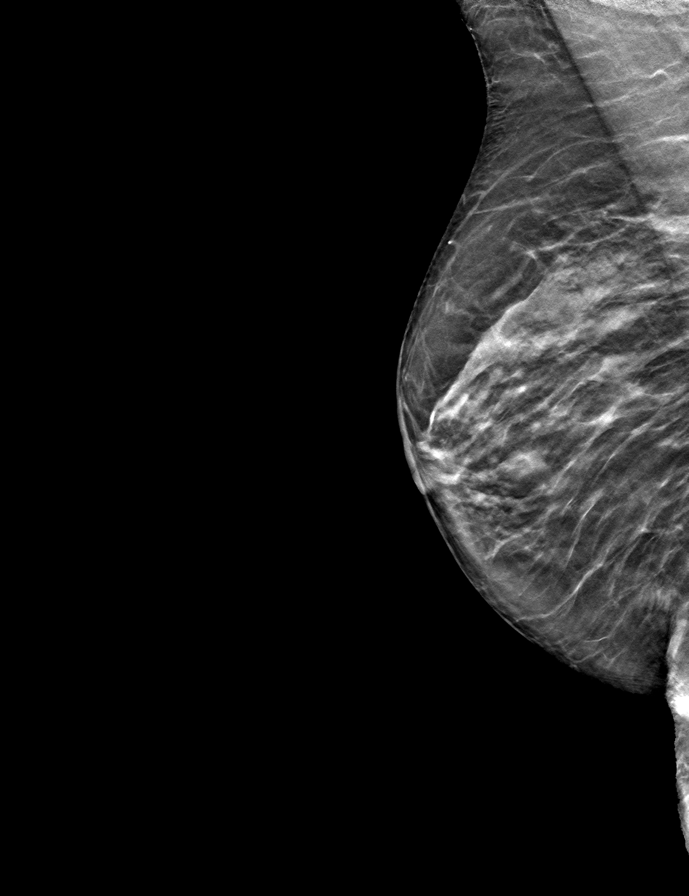
[frame 23/44]
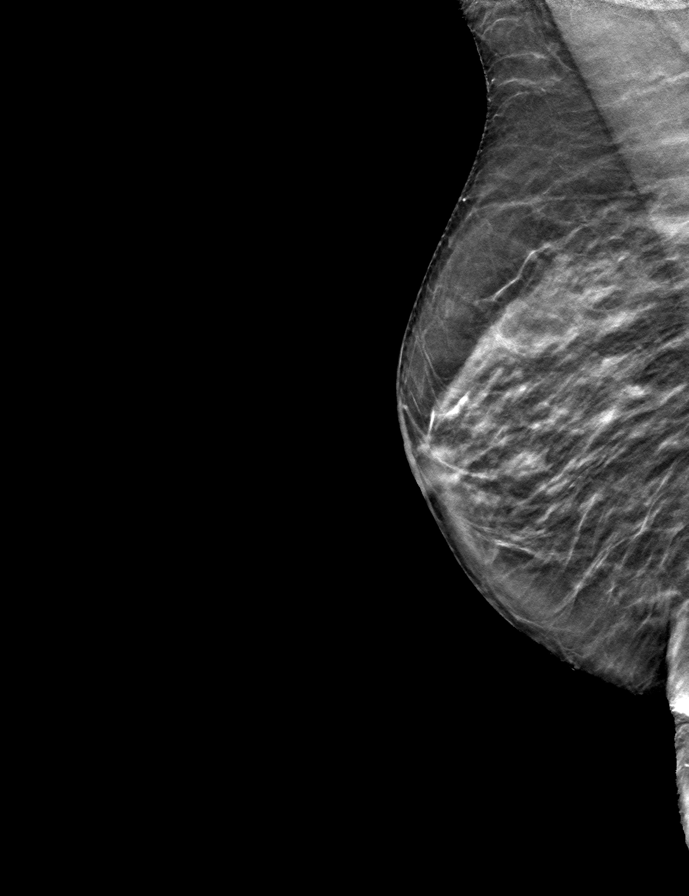

[L CC tomo · tomo slice 23/44.0]
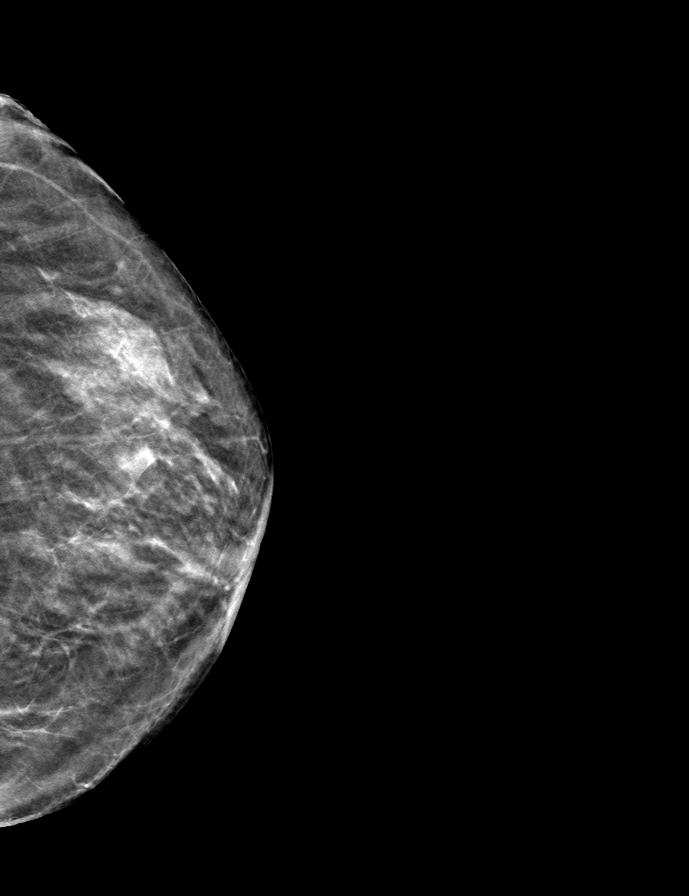

[R CC tomo · tomo slice 21/41.0]
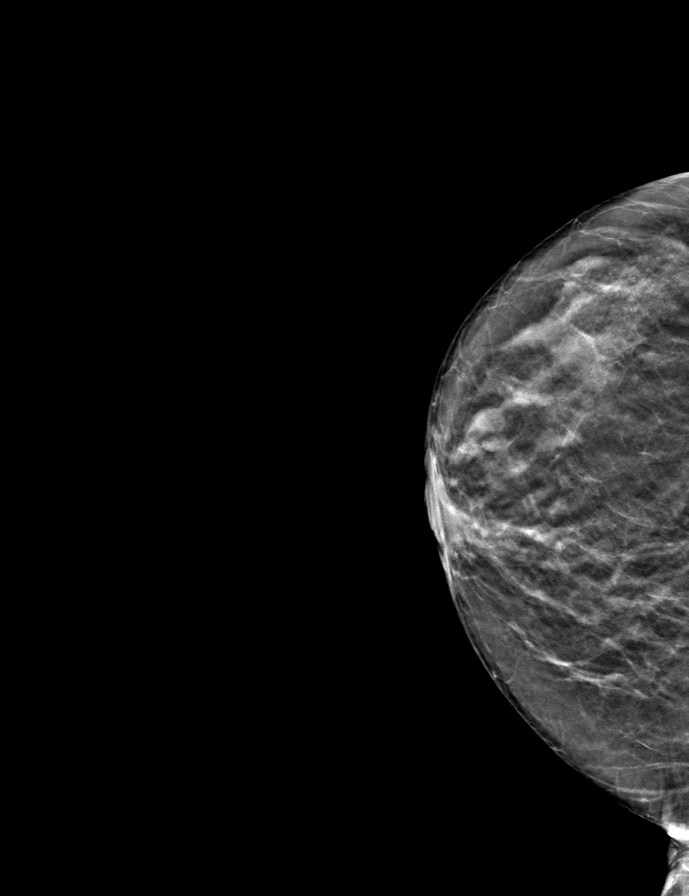

[L MLO tomo · tomo slice 24/47.0]
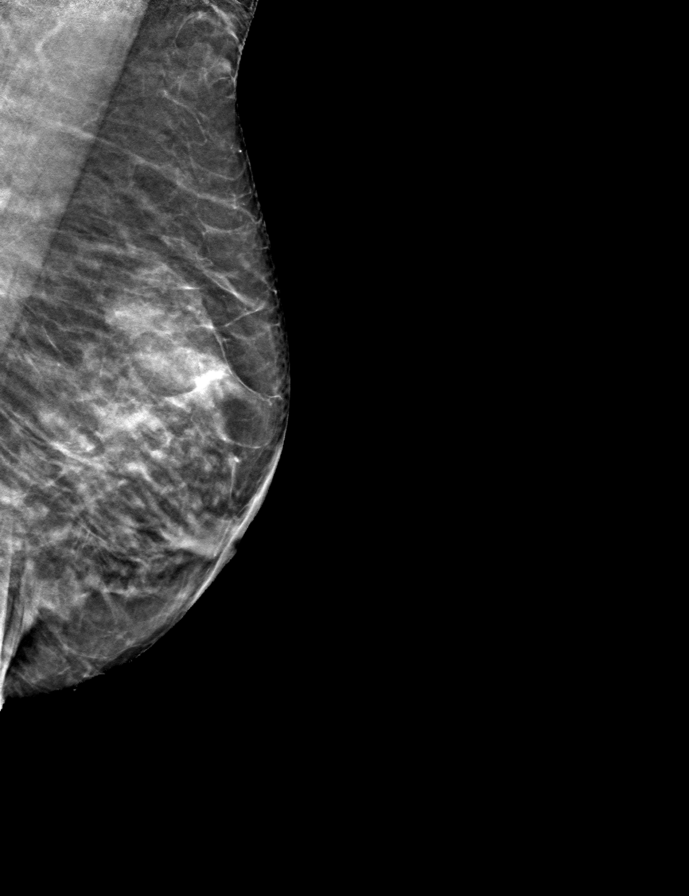

[9 of 24 positions shown; findings below may reference images not displayed]

ACR Breast Density Category c: The breast tissue is heterogeneously
dense, which may obscure small masses.
FINDINGS: There are no findings suspicious for malignancy. Images were
processed with CAD.
IMPRESSION: No mammographic evidence of malignancy. A result letter of this
screening mammogram will be mailed directly to the patient.

RECOMMENDATION:
Screening mammogram in one year. (Code:[id number])

BI-RADS CATEGORY  1: Negative.

## 2022-08-10 ENCOUNTER — Other Ambulatory Visit: Payer: Self-pay | Admitting: Cardiology

## 2022-09-16 NOTE — Progress Notes (Unsigned)
Cardiology Office Note:    Date:  09/17/2022   ID:  Melanie Hampton, DOB [birth year], MRN [id number]  PCP:  Maggie Schwalbe, PA-C  Cardiologist:  Shirlee More, MD    Referring MD: Maggie Schwalbe, PA-C    ASSESSMENT:    1. Paroxysmal supraventricular tachycardia ([place])   2. Benign essential hypertension   3. Familial combined hyperlipidemia    PLAN:    In order of problems listed above:  Melanie Hampton has done well with no recurrent SVT.  I do not think the episode she called our office was primarily heart rhythm or blood pressure and I think she had a typical migraine that day.  I have asked her to consider buying the mobile kardia device or family can check her heart rhythm if there is episodes in the future and she will continue her current beta-blocker Her blood pressure has been well controlled at home she runs 121/73 and high 157/85 typically in the 322G and 254Y systolic and she will continue her diuretic beta-blocker recheck her renal function today She will continue her statin and check a lipid profile CMP   Next appointment: 9 months   Medication Adjustments/Labs and Tests Ordered: Current medicines are reviewed at length with the patient today.  Concerns regarding medicines are outlined above.  No orders of the defined types were placed in this encounter.  No orders of the defined types were placed in this encounter.   Chief Complaint  Patient presents with   Follow-up    For SVT    History of Present Illness:    Melanie Hampton is a 81 y.o. female with a hx of SVT hypertension and hyperlipidemia last seen 09/30/2021.  Compliance with diet, lifestyle and medications: Yes  She had contacted our practice September 5 when she had an event and she was worried about stroke She has background history of migraine She had typical symptoms with severe pounding headache visual changes nausea took migraine Excedrin went to a dark room and the symptoms resolved During the  episode her heart rate was elevated 100 bpm blood pressure 706 systolic and this also normalized She had no palpitation.  Overall she has done well and has been years since she had a migraine headache She is no angina edema shortness of breath. Past Medical History:  Diagnosis Date   Familial combined hyperlipidemia 02/07/2016   GERD (gastroesophageal reflux disease)    Migraine     Past Surgical History:  Procedure Laterality Date   TONSILLECTOMY     TUBAL LIGATION     uterus polyp removal      Current Medications: Current Meds  Medication Sig   aspirin EC 81 MG tablet Take 81 mg by mouth daily.   atorvastatin (LIPITOR) 10 MG tablet Take 1 tablet (10 mg total) by mouth daily.   Cholecalciferol (VITAMIN D-3 PO) Take 1 tablet by mouth daily.   CRANBERRY PO Take 1 tablet by mouth daily.    Cyanocobalamin (VITAMIN B-12 PO) Take 2,500 mg by mouth daily.   esomeprazole (NEXIUM) 20 MG capsule Take 20 mg by mouth daily at 12 noon.   MYRBETRIQ 50 MG TB24 tablet Take 50 mg by mouth daily.   polyethylene glycol powder (GLYCOLAX/MIRALAX) 17 GM/SCOOP powder daily as needed.   propranolol ER (INDERAL LA) 80 MG 24 hr capsule Take 1 capsule (80 mg total) by mouth daily.   scopolamine (TRANSDERM-SCOP) 1 MG/3DAYS Place 1 patch onto the skin every three (3) days as needed.  solifenacin (VESICARE) 5 MG tablet Take 5 mg by mouth daily.   triamterene-hydrochlorothiazide (MAXZIDE-25) 37.5-25 MG tablet Take 1 tablet by mouth once daily   Zinc 50 MG TABS Take 50 mg by mouth daily.     Allergies:   Niacin, Penicillin g, and Amoxicillin-pot clavulanate   Social History   Socioeconomic History   Marital status: Widowed    Spouse name: Not on file   Number of children: Not on file   Years of education: Not on file   Highest education level: Not on file  Occupational History   Not on file  Tobacco Use   Smoking status: Never   Smokeless tobacco: Never  Vaping Use   Vaping Use: Never used   Substance and Sexual Activity   Alcohol use: No   Drug use: No   Sexual activity: Not Currently  Other Topics Concern   Not on file  Social History Narrative   Not on file   Social Determinants of Health   Financial Resource Strain: Not on file  Food Insecurity: Not on file  Transportation Needs: Not on file  Physical Activity: Not on file  Stress: Not on file  Social Connections: Not on file     Family History: The patient's family history includes Cancer in her sister; Hypertension in her mother; Stroke in her father. ROS:   Please see the history of present illness.    All other systems reviewed and are negative.  EKGs/Labs/Other Studies Reviewed:    The following studies were reviewed today:  EKG:  EKG ordered today and personally reviewed.  The ekg ordered today demonstrates sinus rhythm left bundle branch block  Recent Labs: 09/30/2021: ALT 11; BUN 13; Creatinine, Ser 0.96; Potassium 4.9; Sodium 142  Recent Lipid Panel    Component Value Date/Time   CHOL 211 (H) 09/30/2021 1302   TRIG 70 09/30/2021 1302   HDL 80 09/30/2021 1302   CHOLHDL 2.6 09/30/2021 1302   LDLCALC 119 (H) 09/30/2021 1302    Physical Exam:    VS:  BP (!) 144/80 (BP Location: Right Arm, Patient Position: Sitting)   Pulse 61   Ht 5\' 4"  (1.626 m)   Wt 129 lb 3.2 oz (58.6 kg)   LMP  (LMP Unknown)   SpO2 99%   BMI 22.18 kg/m     Wt Readings from Last 3 Encounters:  09/17/22 129 lb 3.2 oz (58.6 kg)  09/30/21 136 lb 3.2 oz (61.8 kg)  02/13/21 141 lb 3.2 oz (64 kg)     GEN:  Well nourished, well developed in no acute distress HEENT: Normal NECK: No JVD; No carotid bruits LYMPHATICS: No lymphadenopathy CARDIAC: RRR, no murmurs, rubs, gallops RESPIRATORY:  Clear to auscultation without rales, wheezing or rhonchi  ABDOMEN: Soft, non-tender, non-distended MUSCULOSKELETAL:  No edema; No deformity  SKIN: Warm and dry NEUROLOGIC:  Alert and oriented x 3 PSYCHIATRIC:  Normal affect     Signed, Shirlee More, MD  09/17/2022 11:42 AM    [name]

## 2022-09-17 ENCOUNTER — Encounter: Payer: Self-pay | Admitting: Cardiology

## 2022-09-17 ENCOUNTER — Ambulatory Visit: Payer: Medicare PPO | Attending: Cardiology | Admitting: Cardiology

## 2022-09-17 VITALS — BP 144/80 | HR 61 | Ht 64.0 in | Wt 129.2 lb

## 2022-09-17 DIAGNOSIS — I471 Supraventricular tachycardia: Secondary | ICD-10-CM

## 2022-09-17 DIAGNOSIS — I10 Essential (primary) hypertension: Secondary | ICD-10-CM | POA: Diagnosis not present

## 2022-09-17 DIAGNOSIS — E7849 Other hyperlipidemia: Secondary | ICD-10-CM

## 2022-09-17 NOTE — Patient Instructions (Signed)
Medication Instructions:  Your physician recommends that you continue on your current medications as directed. Please refer to the Current Medication list given to you today.  *If you need a refill on your cardiac medications before your next appointment, please call your pharmacy*   Lab Work: Your physician recommends that you return for lab work in:   Labs today: CMP, Lipids, CBC  If you have labs (blood work) drawn today and your tests are completely normal, you will receive your results only by: [place] (if you have MyChart) OR A paper copy in the mail If you have any lab test that is abnormal or we need to change your treatment, we will call you to review the results.   Testing/Procedures: None   Follow-Up: At [facility], you and your health needs are our priority.  As part of our continuing mission to provide you with exceptional heart care, we have created designated Provider Care Teams.  These Care Teams include your primary Cardiologist (physician) and Advanced Practice Providers (APPs -  Physician Assistants and Nurse Practitioners) who all work together to provide you with the care you need, when you need it.  We recommend signing up for the patient portal called "MyChart".  Sign up information is provided on this After Visit Summary.  MyChart is used to connect with patients for Virtual Visits (Telemedicine).  Patients are able to view lab/test results, encounter notes, upcoming appointments, etc.  Non-urgent messages can be sent to your provider as well.   To learn more about what you can do with MyChart, go to [email or web address].    Your next appointment:   9 month(s)  The format for your next appointment:   In Person  Provider:   Shirlee More, MD    Other Instructions Get mobile kardia for home  Important Information About Sugar

## 2022-09-18 LAB — COMPREHENSIVE METABOLIC PANEL
ALT: 12 IU/L (ref 0–32)
AST: 19 IU/L (ref 0–40)
Albumin/Globulin Ratio: 2.1 (ref 1.2–2.2)
Albumin: 4.6 g/dL (ref 3.7–4.7)
Alkaline Phosphatase: 108 IU/L (ref 44–121)
BUN/Creatinine Ratio: 16 (ref 12–28)
BUN: 14 mg/dL (ref 8–27)
Bilirubin Total: 0.5 mg/dL (ref 0.0–1.2)
CO2: 26 mmol/L (ref 20–29)
Calcium: 10.1 mg/dL (ref 8.7–10.3)
Chloride: 101 mmol/L (ref 96–106)
Creatinine, Ser: 0.9 mg/dL (ref 0.57–1.00)
Globulin, Total: 2.2 g/dL (ref 1.5–4.5)
Glucose: 82 mg/dL (ref 70–99)
Potassium: 4.2 mmol/L (ref 3.5–5.2)
Sodium: 142 mmol/L (ref 134–144)
Total Protein: 6.8 g/dL (ref 6.0–8.5)
eGFR: 63 mL/min/{1.73_m2} (ref >59)

## 2022-09-18 LAB — CBC
Hematocrit: 43 % (ref 34.0–46.6)
Hemoglobin: 14.3 g/dL (ref 11.1–15.9)
MCH: 30.9 pg (ref 26.6–33.0)
MCHC: 33.3 g/dL (ref 31.5–35.7)
MCV: 93 fL (ref 79–97)
Platelets: 235 10*3/uL (ref 150–450)
RBC: 4.63 x10E6/uL (ref 3.77–5.28)
RDW: 12.1 % (ref 11.7–15.4)
WBC: 4.5 10*3/uL (ref 3.4–10.8)

## 2022-09-18 LAB — LIPID PANEL
Chol/HDL Ratio: 2.4 ratio (ref 0.0–4.4)
Cholesterol, Total: 194 mg/dL (ref 100–199)
HDL: 82 mg/dL (ref >39)
LDL Chol Calc (NIH): 98 mg/dL (ref 0–99)
Triglycerides: 75 mg/dL (ref 0–149)
VLDL Cholesterol Cal: 14 mg/dL (ref 5–40)

## 2022-09-29 ENCOUNTER — Telehealth: Payer: Self-pay | Admitting: Cardiology

## 2022-09-29 NOTE — Telephone Encounter (Signed)
Patient notified of results and mailed a copy as she requested.

## 2022-09-29 NOTE — Telephone Encounter (Signed)
Calling in regards to her labs results. Please advise

## 2022-10-12 ENCOUNTER — Telehealth: Payer: Self-pay | Admitting: Cardiology

## 2022-10-12 NOTE — Telephone Encounter (Signed)
Left VM for pt to call back.

## 2022-10-12 NOTE — Telephone Encounter (Signed)
Patient returning call for lab results. 

## 2022-10-13 NOTE — Telephone Encounter (Signed)
LVM for pt to return call

## 2022-10-13 NOTE — Telephone Encounter (Signed)
Patient returning call. Please advise

## 2022-12-14 ENCOUNTER — Other Ambulatory Visit: Payer: Self-pay | Admitting: Cardiology

## 2022-12-14 NOTE — Telephone Encounter (Signed)
Rx refill sent to pharmacy. 

## 2023-02-09 ENCOUNTER — Other Ambulatory Visit: Payer: Self-pay | Admitting: Cardiology

## 2023-05-05 DIAGNOSIS — S1096XA Insect bite of unspecified part of neck, initial encounter: Secondary | ICD-10-CM | POA: Insufficient documentation

## 2023-05-05 DIAGNOSIS — W57XXXA Bitten or stung by nonvenomous insect and other nonvenomous arthropods, initial encounter: Secondary | ICD-10-CM

## 2023-05-05 HISTORY — DX: Bitten or stung by nonvenomous insect and other nonvenomous arthropods, initial encounter: W57.XXXA

## 2023-07-27 ENCOUNTER — Encounter: Payer: Self-pay | Admitting: *Deleted

## 2023-07-27 NOTE — Progress Notes (Unsigned)
Cardiology Office Note:    Date:  07/28/2023   ID:  Melanie Hampton, DOB 08-02-1941, MRN [id number]  PCP:  Leane Call, PA-C  Cardiologist:  Norman Herrlich, MD    Referring MD: Leane Call, PA-C    ASSESSMENT:    1. Paroxysmal supraventricular tachycardia   2. Benign essential hypertension   3. Familial combined hyperlipidemia    PLAN:    In order of problems listed above:  Stable continue her beta-blocker carvedilol Well-controlled continue her current antihypertensive regimen including diuretic mixed thiazide distal beta-blocker and home monitoring. Continue her statin and recheck a lipid profile   Next appointment: 6 months   Medication Adjustments/Labs and Tests Ordered: Current medicines are reviewed at length with the patient today.  Concerns regarding medicines are outlined above.  No orders of the defined types were placed in this encounter.  No orders of the defined types were placed in this encounter.    History of Present Illness:    Melanie Hampton is a 82 y.o. female with a hx of SVT hypertension and hyper lipidemia last seen 09/17/2022.  Compliance with diet, lifestyle and medications: Yes  Overall she has done well unfortunately her dog companion died a few weeks ago and she is grieving No edema shortness of breath chest pain palpitation or syncope. We discussed propranolol and she wants to stay on her beta-blocker She request to have thyroid and CBC done if she finds herself fatigued at times Past Medical History:  Diagnosis Date   Familial combined hyperlipidemia 02/07/2016   GERD (gastroesophageal reflux disease)    Migraine     Current Medications: Current Meds  Medication Sig   Ascorbic Acid (VITAMIN C) 1000 MG tablet Take 1,000 mg by mouth daily.   aspirin EC 81 MG tablet Take 81 mg by mouth daily.   atorvastatin (LIPITOR) 10 MG tablet Take 1 tablet by mouth once daily   Chlorpheniramine Maleate (ALLERGY RELIEF PO) Take 1 tablet  by mouth daily.   Cholecalciferol (VITAMIN D3) 50 MCG (2000 UT) CAPS Take 2,000 Units by mouth daily.   CRANBERRY PO Take 1 tablet by mouth daily.    Cyanocobalamin (B-12) 5000 MCG CAPS Take 5,000 mg by mouth daily.   esomeprazole (NEXIUM) 20 MG capsule Take 20 mg by mouth daily at 12 noon.   MYRBETRIQ 50 MG TB24 tablet Take 50 mg by mouth daily.   polyethylene glycol powder (GLYCOLAX/MIRALAX) 17 GM/SCOOP powder daily as needed.   propranolol ER (INDERAL LA) 80 MG 24 hr capsule Take 1 capsule by mouth once daily   scopolamine (TRANSDERM-SCOP) 1 MG/3DAYS Place 1 patch onto the skin every three (3) days as needed.   solifenacin (VESICARE) 5 MG tablet Take 5 mg by mouth daily.   triamcinolone cream (KENALOG) 0.5 % Apply 1 Application topically 2 (two) times daily as needed (fire ant and tick bites).   triamterene-hydrochlorothiazide (MAXZIDE-25) 37.5-25 MG tablet Take 1 tablet by mouth once daily   VITAMIN A PO Take 2,000 mEq by mouth daily.   Zinc 50 MG TABS Take 50 mg by mouth daily.      EKGs/Labs/Other Studies Reviewed:    The following studies were reviewed today:  Cardiac Studies & Procedures         MONITORS  LONG TERM MONITOR (3-14 DAYS) 01/16/2020  Narrative An extended ZIO monitor was performed for 7 days and 3 hours beginning 01/16/2020 to assess palpitation.  Cardiac rhythm throughout was sinus with minimum average and maximum heart rates  of 48, 79 and 161 bpm.  Branch block was present throughout.  There were no pauses of 3 seconds or greater and no episodes of AV node or sinus node block.  Supraventricular ectopy was rare with APCs and 1-11 beat run of atrial premature contractions at a rate of 157 bpm paroxysmal atrial tachycardia.  No episodes of atrial fibrillation or flutter.  Ventricular ectopy was occasional with predominant single PVCs and 3 couplets.  The longest episode of bigeminy was 23.7 seconds and trigeminy 3 minutes and 42 seconds.  There were no episodes  of ventricular tachycardia.  There were 13 diary events 8 of them associated with frequent PVCs and often bigeminy. 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               Recent Labs: 09/17/2022: ALT 12; BUN 14; Creatinine, Ser 0.90; Hemoglobin 14.3; Platelets 235; Potassium 4.2; Sodium 142  Recent Lipid Panel    Component Value Date/Time   CHOL 194 09/17/2022 1216   TRIG 75 09/17/2022 1216   HDL 82 09/17/2022 1216   CHOLHDL 2.4 09/17/2022 1216   LDLCALC 98 09/17/2022 1216    Physical Exam:    VS:  BP 130/80 (BP Location: Left Arm, Patient Position: Sitting, Cuff Size: Normal)   Pulse 70   Ht 5\' 4"  (1.626 m)   Wt 124 lb (56.2 kg)   LMP  (LMP Unknown)   SpO2 99%   BMI 21.28 kg/m     Wt Readings from Last 3 Encounters:  07/28/23 124 lb (56.2 kg)  09/17/22 129 lb 3.2 oz (58.6 kg)  09/30/21 136 lb 3.2 oz (61.8 kg)     GEN: Appears her age well nourished, well developed in no acute distress HEENT: Normal NECK: No JVD; No carotid bruits LYMPHATICS: No lymphadenopathy CARDIAC: RRR, no murmurs, rubs, gallops RESPIRATORY:  Clear to auscultation without rales, wheezing or rhonchi  ABDOMEN: Soft, non-tender, non-distended MUSCULOSKELETAL:  No edema; No deformity  SKIN: Warm and dry NEUROLOGIC:  Alert and oriented x 3 PSYCHIATRIC:  Normal affect    Signed, Norman Herrlich, MD  07/28/2023 10:24 AM    [facility]

## 2023-07-28 ENCOUNTER — Ambulatory Visit: Payer: Medicare PPO | Attending: Cardiology | Admitting: Cardiology

## 2023-07-28 ENCOUNTER — Encounter: Payer: Self-pay | Admitting: Cardiology

## 2023-07-28 VITALS — BP 130/80 | HR 70 | Ht 64.0 in | Wt 124.0 lb

## 2023-07-28 DIAGNOSIS — I10 Essential (primary) hypertension: Secondary | ICD-10-CM

## 2023-07-28 DIAGNOSIS — E7849 Other hyperlipidemia: Secondary | ICD-10-CM

## 2023-07-28 DIAGNOSIS — I471 Supraventricular tachycardia, unspecified: Secondary | ICD-10-CM

## 2023-07-28 NOTE — Addendum Note (Signed)
Addended by: Roxanne Mins I on: 07/28/2023 10:58 AM   Modules accepted: Orders

## 2023-07-28 NOTE — Patient Instructions (Signed)
Medication Instructions:  Your physician recommends that you continue on your current medications as directed. Please refer to the Current Medication list given to you today.  *If you need a refill on your cardiac medications before your next appointment, please call your pharmacy*   Lab Work: Your physician recommends that you return for lab work in:   Labs today: CMP, Lipids, CBC, TSH  If you have labs (blood work) drawn today and your tests are completely normal, you will receive your results only by: MyChart Message (if you have MyChart) OR A paper copy in the mail If you have any lab test that is abnormal or we need to change your treatment, we will call you to review the results.   Testing/Procedures: None   Follow-Up: At [facility], you and your health needs are our priority.  As part of our continuing mission to provide you with exceptional heart care, we have created designated Provider Care Teams.  These Care Teams include your primary Cardiologist (physician) and Advanced Practice Providers (APPs -  Physician Assistants and Nurse Practitioners) who all work together to provide you with the care you need, when you need it.  We recommend signing up for the patient portal called "MyChart".  Sign up information is provided on this After Visit Summary.  MyChart is used to connect with patients for Virtual Visits (Telemedicine).  Patients are able to view lab/test results, encounter notes, upcoming appointments, etc.  Non-urgent messages can be sent to your provider as well.   To learn more about what you can do with MyChart, go to [email or web address].    Your next appointment:   6 month(s)  Provider:   Norman Herrlich, MD    Other Instructions None

## 2023-07-29 ENCOUNTER — Encounter: Payer: Self-pay | Admitting: Cardiology

## 2023-07-29 ENCOUNTER — Other Ambulatory Visit: Payer: Self-pay

## 2023-07-29 DIAGNOSIS — E039 Hypothyroidism, unspecified: Secondary | ICD-10-CM

## 2023-07-29 NOTE — Telephone Encounter (Signed)
Error

## 2023-10-08 ENCOUNTER — Telehealth: Payer: Self-pay | Admitting: Cardiology

## 2023-10-08 NOTE — Telephone Encounter (Signed)
Pt c/o BP issue: STAT if pt c/o blurred vision, one-sided weakness or slurred speech  1. What are your last 5 BP readings? 150/95 - Today                   151/93 - Today         125/79 - Yesterday  2. Are you having any other symptoms (ex. Dizziness, headache, blurred vision, passed out)? Headache  3. What is your BP issue? Pt states that BP has been elevated today. Pt is just getting over Covid and would like to know if she can or need to take an extra dose of BP medication. Pt would like a callback regarding this matter. Please advise

## 2023-10-08 NOTE — Telephone Encounter (Signed)
Spoke with pt who complains of elevated BP today with headache.  She is recovering from Covid (Dx 10/3) and states it has not been unusual to have a headache since she has had Covid.She has taken an Excedrin Migraine tablet which has helped.  Pt reports she may have missed her BP medication yesterday.  She is not certain.  She states she is hydrated and tries to follow a low sodium diet.  She denies CP, SOB or edema. Pt advised will forward to Dr Dulce Sellar to further advise.  Pt should continue to monitor BP, 2 hours after taking BP medication and continue a low sodium diet.  Pt verbalizes understanding and agrees with current plan.

## 2023-10-11 NOTE — Telephone Encounter (Signed)
Called patient and informed her of Dr. Dulce Sellar recommendation below:  "Melanie Hampton I do not think blood pressure measurements that she is recorded are the cause of her headache.  I do not want to change any of her medications at this time."  Patient verbalized understanding and had no further questions at this time.

## 2023-10-12 ENCOUNTER — Other Ambulatory Visit: Payer: Self-pay | Admitting: Cardiology

## 2023-10-19 DIAGNOSIS — M542 Cervicalgia: Secondary | ICD-10-CM | POA: Insufficient documentation

## 2023-10-19 HISTORY — DX: Cervicalgia: M54.2

## 2024-01-24 ENCOUNTER — Encounter: Payer: Self-pay | Admitting: Cardiology

## 2024-01-24 NOTE — Progress Notes (Unsigned)
Cardiology Office Note:    Date:  01/25/2024   ID:  Melanie Hampton, DOB 1941-01-10, MRN [id number]  PCP:  Leane Call, PA-C  Cardiologist:  Norman Herrlich, MD    Referring MD: Leane Call, PA-C    ASSESSMENT:    1. Paroxysmal supraventricular tachycardia (HCC)   2. Benign essential hypertension   3. Familial combined hyperlipidemia   4. LBBB (left bundle branch block)    PLAN:    In order of problems listed above:  She has had no recurrence of her tachycardia she will continue her current beta-blocker well-tolerated Hypertension appears controlled at the gravida great deal of variability due to technique we discussed proper technique and I will ask her to be careful give me 2 weeks of numbers and reassess in the office in the interim continue diuretic if another medication is needed I would use an ARB Continue her statin will check labs today including renal function liver function and lipids She has a stable EKG pattern without progressive conduction system disease   Next appointment: 9 months   Medication Adjustments/Labs and Tests Ordered: Current medicines are reviewed at length with the patient today.  Concerns regarding medicines are outlined above.  Orders Placed This Encounter  Procedures   EKG 12-Lead   No orders of the defined types were placed in this encounter.    History of Present Illness:    Melanie Hampton is a 83 y.o. female with a hx of SVT hypertension and hyperlipidemia last seen 07/28/2023.  Compliance with diet, lifestyle and medications: Yes  Overall she is doing well. She did have a moderate case of COVID October Has had no recurrent episodes of SVT She tolerates her statin without muscle pain or weakness She is checking her blood pressure at variable times not resting and is getting variable numbers I have asked her to use good technique for 2 weeks and send me a list of her blood pressure she appears to be controlled in the office  today She is not having edema shortness of breath orthopnea chest pain palpitation or syncope She is approaching her 83th birthday and feels good about the quality of her health. Past Medical History:  Diagnosis Date   Abnormal urinalysis 02/02/2018   Acute sinusitis 04/30/2019   Allergic rhinitis with postnasal drip 01/17/2019   Benign essential hypertension 02/07/2016   Dysuria 02/07/2016   Encounter for Medicare annual wellness exam 03/08/2019   Endometrial polyp 02/07/2016   Familial combined hyperlipidemia 02/07/2016   Gastroesophageal reflux disease without esophagitis 02/07/2016   GERD (gastroesophageal reflux disease)    Inverted nipple 06/04/2020   Leukocytes in urine 02/07/2016   Migraine    Migraine headache 02/07/2016   Musculoskeletal disorder involving upper trapezius muscle 06/09/2021   Neck pain 10/19/2023   Nipple discharge 06/04/2020   Paroxysmal supraventricular tachycardia (HCC) 02/07/2016   Stress incontinence of urine 03/08/2019   Tick bite 05/05/2023   UTI symptoms 04/30/2019    Current Medications: Current Meds  Medication Sig   aspirin EC 81 MG tablet Take 81 mg by mouth daily.   atorvastatin (LIPITOR) 10 MG tablet Take 1 tablet by mouth once daily   Chlorpheniramine Maleate (ALLERGY RELIEF PO) Take 1 tablet by mouth daily.   esomeprazole (NEXIUM) 20 MG capsule Take 20 mg by mouth daily at 12 noon.   MYRBETRIQ 50 MG TB24 tablet Take 50 mg by mouth daily.   polyethylene glycol powder (GLYCOLAX/MIRALAX) 17 GM/SCOOP powder daily as needed.   propranolol  ER (INDERAL LA) 80 MG 24 hr capsule Take 1 capsule by mouth once daily   scopolamine (TRANSDERM-SCOP) 1 MG/3DAYS Place 1 patch onto the skin every three (3) days as needed.   solifenacin (VESICARE) 5 MG tablet Take 5 mg by mouth daily.   triamcinolone cream (KENALOG) 0.5 % Apply 1 Application topically 2 (two) times daily as needed (fire ant and tick bites).   triamterene-hydrochlorothiazide (MAXZIDE-25)  37.5-25 MG tablet Take 1 tablet by mouth once daily      EKGs/Labs/Other Studies Reviewed:    The following studies were reviewed today:  Cardiac Studies & Procedures        MONITORS  LONG TERM MONITOR (3-14 DAYS) 01/16/2020  Narrative An extended ZIO monitor was performed for 7 days and 3 hours beginning 01/16/2020 to assess palpitation.  Cardiac rhythm throughout was sinus with minimum average and maximum heart rates of 48, 79 and 161 bpm.  Branch block was present throughout.  There were no pauses of 3 seconds or greater and no episodes of AV node or sinus node block.  Supraventricular ectopy was rare with APCs and 1-11 beat run of atrial premature contractions at a rate of 157 bpm paroxysmal atrial tachycardia.  No episodes of atrial fibrillation or flutter.  Ventricular ectopy was occasional with predominant single PVCs and 3 couplets.  The longest episode of bigeminy was 23.7 seconds and trigeminy 3 minutes and 42 seconds.  There were no episodes of ventricular tachycardia.  There were 13 diary events 8 of them associated with frequent PVCs and often bigeminy. 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          EKG Interpretation Date/Time:  Tuesday January 25 2024 11:19:02 EST Ventricular Rate:  66 PR Interval:  162 QRS Duration:  134 QT Interval:  440 QTC Calculation: 461 R Axis:   12  Text Interpretation: Normal sinus rhythm Left bundle branch block No previous ECGs available Confirmed by Norman Herrlich ([id number]) on 01/25/2024 11:33:23 AM   Recent Labs: 07/28/2023: ALT 14; BUN 16; Creatinine, Ser 0.97; Hemoglobin 14.4; Platelets 243; Potassium 4.5; Sodium 141; TSH 4.910  Recent Lipid Panel    Component Value Date/Time   CHOL 186 07/28/2023 1117   TRIG 66 07/28/2023 1117   HDL 76 07/28/2023 1117   CHOLHDL 2.4 07/28/2023 1117   LDLCALC 98 07/28/2023 1117     Physical Exam:    VS:  BP (!) 146/88   Pulse 66   Ht 5\' 4"  (1.626 m)   Wt 124 lb (56.2 kg)   LMP  (LMP Unknown)   SpO2 99%   BMI 21.28 kg/m     Wt Readings from Last 3 Encounters:  01/25/24 124 lb (56.2 kg)  07/28/23 124 lb (56.2 kg)  09/17/22 129 lb 3.2 oz (58.6 kg)     GEN:  Well nourished, well developed in no acute distress HEENT: Normal NECK: No JVD; No carotid bruits LYMPHATICS: No lymphadenopathy CARDIAC: RRR, no murmurs, rubs, gallops RESPIRATORY:  Clear to auscultation without rales, wheezing or rhonchi  ABDOMEN: Soft, non-tender, non-distended MUSCULOSKELETAL:  No edema; No deformity  SKIN: Warm and dry NEUROLOGIC:  Alert and oriented x 3 PSYCHIATRIC:  Normal affect    Signed, Norman Herrlich, MD  01/25/2024 11:56 AM    [facility]

## 2024-01-25 ENCOUNTER — Encounter: Payer: Self-pay | Admitting: Cardiology

## 2024-01-25 ENCOUNTER — Ambulatory Visit: Payer: Medicare PPO | Attending: Cardiology | Admitting: Cardiology

## 2024-01-25 ENCOUNTER — Telehealth: Payer: Self-pay | Admitting: Cardiology

## 2024-01-25 VITALS — BP 146/88 | HR 66 | Ht 64.0 in | Wt 124.0 lb

## 2024-01-25 DIAGNOSIS — I447 Left bundle-branch block, unspecified: Secondary | ICD-10-CM

## 2024-01-25 DIAGNOSIS — I471 Supraventricular tachycardia, unspecified: Secondary | ICD-10-CM | POA: Diagnosis not present

## 2024-01-25 DIAGNOSIS — I10 Essential (primary) hypertension: Secondary | ICD-10-CM | POA: Diagnosis not present

## 2024-01-25 DIAGNOSIS — E7849 Other hyperlipidemia: Secondary | ICD-10-CM

## 2024-01-25 MED ORDER — PROPRANOLOL HCL ER 80 MG PO CP24: 80.0000 mg | ORAL_CAPSULE | Freq: Every day | ORAL | 2 refills | Status: DC

## 2024-01-25 MED ORDER — ATORVASTATIN CALCIUM 10 MG PO TABS: 10.0000 mg | ORAL_TABLET | Freq: Every day | ORAL | 0 refills | Status: DC

## 2024-01-25 MED ORDER — TRIAMTERENE-HCTZ 37.5-25 MG PO TABS: 1.0000 | ORAL_TABLET | Freq: Every day | ORAL | 2 refills | Status: DC

## 2024-01-25 NOTE — Telephone Encounter (Signed)
Pt c/o medication issue:  1. Name of Medication:   MYRBETRIQ 50 MG TB24 tablet    solifenacin (VESICARE) 5 MG tablet    2. How are you currently taking this medication (dosage and times per day)?  Take 50 mg by mouth daily.     Take 5 mg by mouth daily.      3. Are you having a reaction (difficulty breathing--STAT)? No  4. What is your medication issue? Pt is requesting a callback to see if she can get a refill on these 2 medications because she's not sure so needs clarity before putting in a request. Please advise

## 2024-01-25 NOTE — Telephone Encounter (Signed)
*  STAT* If patient is at the pharmacy, call can be transferred to refill team.   1. Which medications need to be refilled? (please list name of each medication and dose if known)   atorvastatin (LIPITOR) 10 MG tablet    propranolol ER (INDERAL LA) 80 MG 24 hr capsule    triamterene-hydrochlorothiazide (MAXZIDE-25) 37.5-25 MG tablet    2. Which pharmacy/location (including street and city if local pharmacy) is medication to be sent to?  [id number] - [place], [place] - [address]      3. Do they need a 30 day or 90 day supply? 90 day   Pt is out of medication

## 2024-01-25 NOTE — Patient Instructions (Addendum)
Medication Instructions:  Your physician recommends that you continue on your current medications as directed. Please refer to the Current Medication list given to you today.  *If you need a refill on your cardiac medications before your next appointment, please call your pharmacy*   Lab Work: Your physician recommends that you return for lab work in:   Labs today: CMP, Lipids  If you have labs (blood work) drawn today and your tests are completely normal, you will receive your results only by: MyChart Message (if you have MyChart) OR A paper copy in the mail If you have any lab test that is abnormal or we need to change your treatment, we will call you to review the results.   Testing/Procedures: None   Follow-Up: At [facility], you and your health needs are our priority.  As part of our continuing mission to provide you with exceptional heart care, we have created designated Provider Care Teams.  These Care Teams include your primary Cardiologist (physician) and Advanced Practice Providers (APPs -  Physician Assistants and Nurse Practitioners) who all work together to provide you with the care you need, when you need it.  We recommend signing up for the patient portal called "MyChart".  Sign up information is provided on this After Visit Summary.  MyChart is used to connect with patients for Virtual Visits (Telemedicine).  Patients are able to view lab/test results, encounter notes, upcoming appointments, etc.  Non-urgent messages can be sent to your provider as well.   To learn more about what you can do with MyChart, go to [email or web address].    Your next appointment:   9 month(s)  Provider:   Norman Herrlich, MD    Other Instructions Check and record blood pressure daily for 2 weeks then after two weeks bring a list of blood pressures to the office.            [facility]  Tips to measure your blood pressure correctly  To determine whether you have  hypertension, a medical professional will take a blood pressure reading. How you prepare for the test, the position of your arm, and other factors can change a blood pressure reading by 10% or more. That could be enough to hide high blood pressure, start you on a drug you don't really need, or lead your doctor to incorrectly adjust your medications. National and international guidelines offer specific instructions for measuring blood pressure. If a doctor, nurse, or medical assistant isn't doing it right, don't hesitate to ask him or her to get with the guidelines. Here's what you can do to ensure a correct reading:  Don't drink a caffeinated beverage or smoke during the 30 minutes before the test.  Sit quietly for five minutes before the test begins.  During the measurement, sit in a chair with your feet on the floor and your arm supported so your elbow is at about heart level.  The inflatable part of the cuff should completely cover at least 80% of your upper arm, and the cuff should be placed on bare skin, not over a shirt.  Don't talk during the measurement.  Have your blood pressure measured twice, with a brief break in between. If the readings are different by 5 points or more, have it done a third time. There are times to break these rules. If you sometimes feel lightheaded when getting out of bed in the morning or when you stand after sitting, you should have your blood pressure checked while seated  and then while standing to see if it falls from one position to the next. Because blood pressure varies throughout the day, your doctor will rarely diagnose hypertension on the basis of a single reading. Instead, he or she will want to confirm the measurements on at least two occasions, usually within a few weeks of one another. The exception to this rule is if you have a blood pressure reading of 180/110 mm Hg or higher. A result this high usually calls for prompt treatment. It's also a good idea to  have your blood pressure measured in both arms at least once, since the reading in one arm (usually the right) may be higher than that in the left. A 2014 study in The American Journal of Medicine of nearly 3,400 people found average arm- to-arm differences in systolic blood pressure of about 5 points. The higher number should be used to make treatment decisions. In 2017, new guidelines from the American Heart Association, the [employer] of Cardiology, and nine other health organizations lowered the diagnosis of high blood pressure to 130/80 mm Hg or higher for all adults. The guidelines also redefined the various blood pressure categories to now include normal, elevated, Stage 1 hypertension, Stage 2 hypertension, and hypertensive crisis (see "Blood pressure categories"). Blood pressure categories  Blood pressure category SYSTOLIC (upper number)  DIASTOLIC (lower number)  Normal Less than 120 mm Hg and Less than 80 mm Hg  Elevated 120-129 mm Hg and Less than 80 mm Hg  High blood pressure: Stage 1 hypertension 130-139 mm Hg or 80-89 mm Hg  High blood pressure: Stage 2 hypertension 140 mm Hg or higher or 90 mm Hg or higher  Hypertensive crisis (consult your doctor immediately) Higher than 180 mm Hg and/or Higher than 120 mm Hg  Source: American Heart Association and American Stroke Association. For more on getting your blood pressure under control, buy Controlling Your Blood Pressure, a Special Health Report from [facility].   1. Avoid all over-the-counter antihistamines except Claritin/Loratadine and Zyrtec/Cetrizine. 2. Avoid all combination including cold sinus allergies flu decongestant and sleep medications 3. You can use Robitussin DM Mucinex and Mucinex DM for cough. 4. can use Tylenol aspirin ibuprofen and naproxen but no combinations such as sleep or sinus.

## 2024-01-26 LAB — COMPREHENSIVE METABOLIC PANEL
ALT: 14 [IU]/L (ref 0–32)
AST: 21 [IU]/L (ref 0–40)
Albumin: 4.5 g/dL (ref 3.7–4.7)
Alkaline Phosphatase: 112 [IU]/L (ref 44–121)
BUN/Creatinine Ratio: 19 (ref 12–28)
BUN: 18 mg/dL (ref 8–27)
Bilirubin Total: 0.4 mg/dL (ref 0.0–1.2)
CO2: 26 mmol/L (ref 20–29)
Calcium: 10.2 mg/dL (ref 8.7–10.3)
Chloride: 101 mmol/L (ref 96–106)
Creatinine, Ser: 0.94 mg/dL (ref 0.57–1.00)
Globulin, Total: 2.2 g/dL (ref 1.5–4.5)
Glucose: 90 mg/dL (ref 70–99)
Potassium: 4.3 mmol/L (ref 3.5–5.2)
Sodium: 141 mmol/L (ref 134–144)
Total Protein: 6.7 g/dL (ref 6.0–8.5)
eGFR: 60 mL/min/{1.73_m2} (ref >59)

## 2024-01-26 LAB — LIPID PANEL
Chol/HDL Ratio: 2.4 {ratio} (ref 0.0–4.4)
Cholesterol, Total: 200 mg/dL — ABNORMAL HIGH (ref 100–199)
HDL: 82 mg/dL (ref >39)
LDL Chol Calc (NIH): 104 mg/dL — ABNORMAL HIGH (ref 0–99)
Triglycerides: 78 mg/dL (ref 0–149)
VLDL Cholesterol Cal: 14 mg/dL (ref 5–40)

## 2024-01-26 NOTE — Telephone Encounter (Signed)
Informed patient we cannot refill these medications.  I told her to contact her PCP for refills on these medications.  While on the phone she asked that we mail her a copy of her lab results.  I gave patient results from Dr. Dulce Sellar while I had patient on the phone. She thanked me for the call and had no additional questions.

## 2024-01-26 NOTE — Telephone Encounter (Signed)
Refill of Atorvastatin 80 mg, Propranolol 80 mg, and Maxzide 37.5-25 mg sent to [facility] at patient's appointment on 01/25/24.

## 2024-06-16 ENCOUNTER — Telehealth: Payer: Self-pay | Admitting: Cardiology

## 2024-06-16 ENCOUNTER — Other Ambulatory Visit: Payer: Self-pay | Admitting: Cardiology

## 2024-06-16 NOTE — Telephone Encounter (Signed)
 Pt c/o BP issue: STAT if pt c/o blurred vision, one-sided weakness or slurred speech.  STAT if BP is GREATER than 180/120 TODAY.  STAT if BP is LESS than 90/60 and SYMPTOMATIC TODAY  1. What is your BP concern? Pt concerned that her bp has been elevated   2. Have you taken any BP medication today? Yes at 10am (Propanolol, Maxzide, and Atorvastatin )  3. What are your last 5 BP readings?  9:47am: 153/96 hr 85 - before medication  11:37am: 158/100 hr 83 - after medication   4. Are you having any other symptoms (ex. Dizziness, headache, blurred vision, passed out)? No

## 2024-06-16 NOTE — Telephone Encounter (Signed)
 Patient identification verified by 2 forms. Sims Duck, RN     Called and spoke to patient  Patient states:  -9:47am: 153/96 hr 85 - before medication  11:37am: 158/100 hr 83 - after medication  - missed a couple of doses the last few days.  - last few readings are 6/17 146/82 HR 86, at 2:05 137/77 hr 83  - Does not consistently check her blood pressure daily or weekly.   Patient denies:  - SOB, chest pain, dizziness, muscle weakness, slurred speech, facial drooping.              Interventions/Plan: - Per last office note Hypertension appears controlled at the gravida great deal of variability due to technique we discussed proper technique and I will ask her to be careful give me 2 weeks of numbers and reassess in the office in the interim continue diuretic if another medication is needed I would use an ARB  - Encounter routed to primary cardiologist for review/ recommendations.    Reviewed ED warning signs/precautions  Patient agrees with plan, no questions at this time

## 2024-06-16 NOTE — Telephone Encounter (Signed)
 Patient is following up. She would like feedback this afternoon if possible. She understands Dr. Sandee Crook is on leave and she would like to know if another provider may be able to advise.

## 2024-06-22 NOTE — Telephone Encounter (Signed)
 Attempted to call the patient. Patient did not answer the phone. Do not have a remote access code so unable to leave a message.

## 2024-06-26 NOTE — Telephone Encounter (Signed)
 Called the patient and informed her of Dr. Madireddy's recommendation below:  Elderly woman.  Suboptimal blood pressure readings. Please have her come in for a visit with one of the providers at the office, to check up on blood pressure readings and have her bring log of blood pressure readings over the last few days, to consistently check once in the morning and once prior to bedtime for the next few days prior to the office visit. If consistently elevated, would be a candidate to be started on losartan 25 mg once daily. Thank you  The patient stated that her blood pressure readings have been better over the past week. She did not want to set - up a follow up appointment with one of our providers to check up on her blood pressure readings at this time. I instructed her to make sure she takes her blood pressure medication first and then check her blood pressure at least 2 hours later. Patient stated that she would do that. She also stated that she would check her blood pressure in the morning and evening as Dr. Liborio had recommended and she would call back and set - up an appointment if her blood pressure was elevated again. Patient had no further questions at this time.

## 2024-07-13 ENCOUNTER — Telehealth: Payer: Self-pay | Admitting: Internal Medicine

## 2024-07-13 NOTE — Telephone Encounter (Signed)
 Patient called about high blood pressure. She endorsed left hand tingling that started around 8pm. She felt unwell and checked her blood pressure. Her SBP at 7pm was 188 with HR 106 bpm. Patient is unsure if she took her blood pressure medications. She takes losartan 25mg  daily, propanolol and hydrochlorothiazide combo pill. I instructed patient to go the Emergency room for further evaluation. Patient states she will call her daughter and go to the ER [facility] Emergency Department) if her symptoms do not worsen.

## 2024-07-14 DIAGNOSIS — R079 Chest pain, unspecified: Secondary | ICD-10-CM | POA: Diagnosis not present

## 2024-07-14 DIAGNOSIS — I160 Hypertensive urgency: Secondary | ICD-10-CM | POA: Diagnosis not present

## 2024-07-14 DIAGNOSIS — E785 Hyperlipidemia, unspecified: Secondary | ICD-10-CM | POA: Diagnosis not present

## 2024-07-14 DIAGNOSIS — I447 Left bundle-branch block, unspecified: Secondary | ICD-10-CM | POA: Diagnosis not present

## 2024-07-28 ENCOUNTER — Other Ambulatory Visit: Payer: Self-pay

## 2024-07-28 ENCOUNTER — Telehealth: Payer: Self-pay | Admitting: Cardiology

## 2024-07-28 NOTE — Telephone Encounter (Signed)
 Called the patient and she reported that her blood pressure has been elevated since she was discharged from the hospital. She was in the hospital starting from 7/17 to 7/20 for high blood pressure. Since she has been out of the hospital her blood pressures have been:  7/28 - 139/72 7/30 - 167/102 8/1   - 151/88  Patient stated that she was taking her blood pressure first thing in the morning before taking her medication. I explained that she should take her medication first and then two hours later check her blood pressure. Patient stated that she would try to do that. She states that she is not having any headaches at this time and she is currently taking triamterene  with hydrochlorothiazide and propanolol. Please advise.

## 2024-07-28 NOTE — Telephone Encounter (Signed)
 Pt c/o BP issue: STAT if pt c/o blurred vision, one-sided weakness or slurred speech.  STAT if BP is GREATER than 180/120 TODAY.  STAT if BP is LESS than 90/60 and SYMPTOMATIC TODAY  1. What is your BP concern? Pt called in stating her bp is still high   2. Have you taken any BP medication today? Yes   3. What are your last 5 BP readings?  151/82  159/95   4. Are you having any other symptoms (ex. Dizziness, headache, blurred vision, passed out)? Headache

## 2024-07-28 NOTE — Telephone Encounter (Signed)
 She states that she is currently taking triamterene  with hydrochlorothiazide and propanolol for her blood pressure.

## 2024-07-29 ENCOUNTER — Ambulatory Visit: Payer: Self-pay | Admitting: Cardiology

## 2024-08-03 DIAGNOSIS — S41101A Unspecified open wound of right upper arm, initial encounter: Secondary | ICD-10-CM | POA: Insufficient documentation

## 2024-08-03 HISTORY — DX: Unspecified open wound of right upper arm, initial encounter: S41.101A

## 2024-08-10 NOTE — Telephone Encounter (Signed)
 Spoke with Dr. Monetta regarding this patient's question regarding her blood pressures and he recommended that she check and record her blood pressure twice a day for 1 week and then send the results to the office. I called the patient and informed her of Dr. Leandrew recommendation and she stated that her blood pressures were better. She had started checking them daily and over the past three days her blood pressures were: 122/72, 129/77 and 123/73. Patient stated that if the blood pressures became elevated again she would keep a log of them and send it to the office.Patient had no further questions at this time.

## 2024-08-15 ENCOUNTER — Encounter: Payer: Self-pay | Admitting: Cardiology

## 2024-08-15 ENCOUNTER — Ambulatory Visit: Attending: Cardiology | Admitting: Cardiology

## 2024-08-15 VITALS — BP 160/86 | HR 64 | Ht 64.0 in | Wt 122.2 lb

## 2024-08-15 DIAGNOSIS — I447 Left bundle-branch block, unspecified: Secondary | ICD-10-CM

## 2024-08-15 DIAGNOSIS — I10 Essential (primary) hypertension: Secondary | ICD-10-CM | POA: Diagnosis not present

## 2024-08-15 DIAGNOSIS — I420 Dilated cardiomyopathy: Secondary | ICD-10-CM

## 2024-08-15 DIAGNOSIS — I471 Supraventricular tachycardia, unspecified: Secondary | ICD-10-CM | POA: Diagnosis not present

## 2024-08-15 HISTORY — DX: Dilated cardiomyopathy: I42.0

## 2024-08-15 HISTORY — DX: Left bundle-branch block, unspecified: I44.7

## 2024-08-15 MED ORDER — LISINOPRIL 10 MG PO TABS: 10.0000 mg | ORAL_TABLET | Freq: Every day | ORAL | 3 refills | Status: DC

## 2024-08-15 NOTE — Patient Instructions (Addendum)
 Medication Instructions:   STOP: Maxide  START: Lisinopril  10mg  1 tablet daily  NURSE VISIT- 1 week for BP check after stopping Maxide and starting Lisinopril     Lab Work: None Ordered If you have labs (blood work) drawn today and your tests are completely normal, you will receive your results only by: [employer] (if you have MyChart) OR A paper copy in the mail If you have any lab test that is abnormal or we need to change your treatment, we will call you to review the results.   Testing/Procedures: None Ordered   Follow-Up: At [facility], you and your health needs are our priority.  As part of our continuing mission to provide you with exceptional heart care, we have created designated Provider Care Teams.  These Care Teams include your primary Cardiologist (physician) and Advanced Practice Providers (APPs -  Physician Assistants and Nurse Practitioners) who all work together to provide you with the care you need, when you need it.  We recommend signing up for the patient portal called MyChart.  Sign up information is provided on this After Visit Summary.  MyChart is used to connect with patients for Virtual Visits (Telemedicine).  Patients are able to view lab/test results, encounter notes, upcoming appointments, etc.  Non-urgent messages can be sent to your provider as well.   To learn more about what you can do with MyChart, go to [email or web address].    Your next appointment:   6 week(s)  The format for your next appointment:   In Person  Provider:   Lamar Fitch, MD    Other Instructions NA

## 2024-08-15 NOTE — Progress Notes (Signed)
 Cardiology Office Note:    Date:  08/15/2024   ID:  Melanie Hampton, DOB 1941/07/08, MRN [id number]  PCP:  Patient, No Pcp Per  Cardiologist:  Lamar Fitch, MD    Referring MD: No ref. provider found   No chief complaint on file.   History of Present Illness:    Melanie Hampton is a 83 y.o. female past medical history significant for supraventricular tachycardia paroxysmal, essential hypertension, recently in the being in [facility] secondary to high blood pressure.  She was also noted to have left bundle branch which is chronic, cardiomyopathy with ejection fraction 40% based on echocardiogram, stress test has been done which showed no evidence of ischemia.  Just fixed defect in the anterior wall probably related to bundle branch block.  Comes today 2 months of follow-up blood pressure is better she brought results of her blood pressure measurements from home blood pressures look much better however she changed her medication to prior medications she was discharged home with carvedilol twice daily as well as lisinopril  but she moved back to prior medication which is propranolol  and Maxide.  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   Gastroesophageal reflux disease without esophagitis 02/07/2016   GERD (gastroesophageal reflux disease)    Insect bite of neck 05/05/2023   Inverted nipple 06/04/2020   Leukocytes in urine 02/07/2016   Migraine    Migraine headache 02/07/2016   Musculoskeletal disorder involving upper trapezius muscle 06/09/2021   Neck pain 10/19/2023   Nipple discharge 06/04/2020    Paroxysmal supraventricular tachycardia (HCC) 02/07/2016   Stress incontinence of urine 03/08/2019   Tick bite 05/05/2023   UTI symptoms 04/30/2019    Past Surgical History:  Procedure Laterality Date   TONSILLECTOMY     TUBAL LIGATION     uterus polyp removal      Current Medications: Current Meds  Medication Sig   aspirin EC 81 MG tablet Take 81 mg by mouth daily.   atorvastatin  (LIPITOR) 10 MG tablet Take 1 tablet by mouth once daily   Chlorpheniramine Maleate (ALLERGY RELIEF PO) Take 1 tablet by mouth daily.   esomeprazole (NEXIUM) 20 MG capsule Take 20 mg by mouth daily at 12 noon.   MYRBETRIQ 50 MG TB24 tablet Take 50 mg by mouth daily.   polyethylene glycol powder (GLYCOLAX/MIRALAX) 17 GM/SCOOP powder daily as needed.   propranolol  ER (INDERAL  LA) 80 MG 24 hr capsule Take 1 capsule (80 mg total) by mouth daily.   scopolamine (TRANSDERM-SCOP) 1 MG/3DAYS Place 1 patch onto the skin every three (3) days as needed.   solifenacin (VESICARE) 5 MG tablet Take 5 mg by mouth daily.   triamcinolone cream (KENALOG) 0.5 % Apply 1 Application topically 2 (two) times daily as needed (fire ant and tick bites).   triamterene -hydrochlorothiazide (MAXZIDE-25) 37.5-25 MG tablet Take 1 tablet by mouth daily.     Allergies:   Niacin, Penicillin g, and Amoxicillin-pot clavulanate   Social History   Socioeconomic History   Marital status: Widowed    Spouse name: Not on file   Number of children: Not on file   Years of education: Not on file   Highest  education level: Not on file  Occupational History   Not on file  Tobacco Use   Smoking status: Never   Smokeless tobacco: Never  Vaping Use   Vaping status: Never Used  Substance and Sexual Activity   Alcohol use: No   Drug use: No   Sexual activity: Not Currently  Other Topics Concern   Not on file  Social History Narrative   Not on file   Social Drivers of Health   Financial Resource Strain: Not on file  Food Insecurity: Not  on file  Transportation Needs: Not on file  Physical Activity: Not on file  Stress: Not on file  Social Connections: Not on file     Family History: The patient's family history includes Cancer in her sister; Hypertension in her mother; Stroke in her father. ROS:   Please see the history of present illness.    All 14 point review of systems negative except as described per history of present illness  EKGs/Labs/Other Studies Reviewed:         Recent Labs: 01/25/2024: ALT 14; BUN 18; Creatinine, Ser 0.94; Potassium 4.3; Sodium 141  Recent Lipid Panel    Component Value Date/Time   CHOL 200 (H) 01/25/2024 1205   TRIG 78 01/25/2024 1205   HDL 82 01/25/2024 1205   CHOLHDL 2.4 01/25/2024 1205   LDLCALC 104 (H) 01/25/2024 1205    Physical Exam:    VS:  BP (!) 160/86   Pulse 64   Ht 5' 4 (1.626 m)   Wt 122 lb 3.2 oz (55.4 kg)   LMP  (LMP Unknown)   SpO2 99%   BMI 20.98 kg/m     Wt Readings from Last 3 Encounters:  08/15/24 122 lb 3.2 oz (55.4 kg)  01/25/24 124 lb (56.2 kg)  07/28/23 124 lb (56.2 kg)     GEN:  Well nourished, well developed in no acute distress HEENT: Normal NECK: No JVD; No carotid bruits LYMPHATICS: No lymphadenopathy CARDIAC: RRR, no murmurs, no rubs, no gallops RESPIRATORY:  Clear to auscultation without rales, wheezing or rhonchi  ABDOMEN: Soft, non-tender, non-distended MUSCULOSKELETAL:  No edema; No deformity  SKIN: Warm and dry LOWER EXTREMITIES: no swelling NEUROLOGIC:  Alert and oriented x 3 PSYCHIATRIC:  Normal affect   ASSESSMENT:    1. Paroxysmal supraventricular tachycardia (HCC)   2. Benign essential hypertension   3. Left bundle branch block   4. 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 Benign essential hypertension blood pressure seems to be well-controlled  however medication need to be adjusted I asked her to stop taking Maxide and will start giving her lisinopril  10 mg daily, next week will bring her to the office to check blood pressure and check Chem-7.  In the future anticipate need to replace her propranolol  to carvedilol, however she complained of the fact that she missed half of the time of her p.m. medication therefore may be a better choice for her will be Toprol-XL. Left bundle branch block.  Noted, probably incidental finding, stress test negative.   Medication Adjustments/Labs and Tests Ordered: Current medicines are reviewed at length with the patient today.  Concerns regarding medicines are outlined above.  No orders of the defined types were placed in this encounter.  Medication changes: No orders of the defined types were placed in this encounter.  Signed, Lamar DOROTHA Fitch, MD, [facility] 08/15/2024 4:14 PM    [facility]

## 2024-08-15 NOTE — Addendum Note (Signed)
 Addended by: ARLOA PLANAS D on: 08/15/2024 04:21 PM   Modules accepted: Orders

## 2024-08-16 ENCOUNTER — Telehealth: Payer: Self-pay | Admitting: Cardiology

## 2024-08-16 NOTE — Telephone Encounter (Signed)
 Pts daughter would like a c/b in regrads to the pts appt on 8/19

## 2024-08-16 NOTE — Telephone Encounter (Signed)
 Left vm to return call.

## 2024-08-17 NOTE — Telephone Encounter (Signed)
 Daughter is returning call. Please return call to [phone].

## 2024-08-17 NOTE — Telephone Encounter (Signed)
 Left message for the patient to call back.

## 2024-08-17 NOTE — Telephone Encounter (Signed)
 LVM for Pts daughter to return call

## 2024-08-18 NOTE — Telephone Encounter (Signed)
 Patient's daughter stated that the patient has been having multiple UTI's and she states that she wants the the patient to start taking D-mannose 500 mg OTC to help with her UTI's. She was asking if this supplement was ok to take with her other cardiac medications. Please advise.

## 2024-08-22 ENCOUNTER — Telehealth: Payer: Self-pay | Admitting: Cardiology

## 2024-08-22 DIAGNOSIS — I10 Essential (primary) hypertension: Secondary | ICD-10-CM

## 2024-08-22 DIAGNOSIS — R0609 Other forms of dyspnea: Secondary | ICD-10-CM

## 2024-08-22 NOTE — Telephone Encounter (Signed)
 Spoke with pt. She has increased swelling in her legs and feet but no shortness of breath. Dr. Bernie recommends BMP and ProBNP. Pt will be here for Nurse visit tomorrow for BP check after stopping Bumex. Pt stated that she may come today for the labs.

## 2024-08-22 NOTE — Telephone Encounter (Signed)
 Pt c/o swelling/edema: STAT if pt has developed SOB within 24 hours  If swelling, where is the swelling located? Legs and feet   How much weight have you gained and in what time span? N/A  Have you gained 2 pounds in a day or 5 pounds in a week? Yes  Do you have a log of your daily weights (if so, list)? 122.2 126  Are you currently taking a fluid pill? No  Are you currently SOB? No  Have you traveled recently in a car or plane for an extended period of time?

## 2024-08-23 ENCOUNTER — Ambulatory Visit: Attending: Cardiology

## 2024-08-23 ENCOUNTER — Telehealth: Payer: Self-pay

## 2024-08-23 ENCOUNTER — Ambulatory Visit: Payer: Self-pay | Admitting: Cardiology

## 2024-08-23 VITALS — BP 180/100 | HR 72 | Ht 64.0 in | Wt 125.0 lb

## 2024-08-23 DIAGNOSIS — R0609 Other forms of dyspnea: Secondary | ICD-10-CM

## 2024-08-23 LAB — BASIC METABOLIC PANEL WITH GFR
BUN/Creatinine Ratio: 15 (ref 12–28)
BUN: 13 mg/dL (ref 8–27)
CO2: 26 mmol/L (ref 20–29)
Calcium: 10 mg/dL (ref 8.7–10.3)
Chloride: 105 mmol/L (ref 96–106)
Creatinine, Ser: 0.86 mg/dL (ref 0.57–1.00)
Glucose: 77 mg/dL (ref 70–99)
Potassium: 4.6 mmol/L (ref 3.5–5.2)
Sodium: 145 mmol/L — ABNORMAL HIGH (ref 134–144)
eGFR: 66 mL/min/1.73 (ref >59)

## 2024-08-23 LAB — PRO B NATRIURETIC PEPTIDE: NT-Pro BNP: 599 pg/mL (ref 0–738)

## 2024-08-23 MED ORDER — FUROSEMIDE 20 MG PO TABS: 20.0000 mg | ORAL_TABLET | Freq: Every day | ORAL | 3 refills | Status: DC

## 2024-08-23 NOTE — Telephone Encounter (Signed)
 Lasix  20mg  1 tablet daily per Dr. Krasowski. Pt will call next week with BP update

## 2024-08-23 NOTE — Progress Notes (Unsigned)
   Nurse Visit   Date of Encounter: 08/23/2024 ID: Inocente Mani, DOB 1941-09-27, MRN [id number]  PCP:  Patient, No Pcp Per   [facility] Providers Cardiologist:  None { Click to update primary MD,subspecialty MD or APP then REFRESH:1}     Visit Details   VS:  BP (!) 180/100 (BP Location: Left Arm, Patient Position: Sitting)   Pulse 72   Ht 5' 4 (1.626 m)   Wt 125 lb (56.7 kg)   LMP  (LMP Unknown)   SpO2 98%   BMI 21.46 kg/m  , BMI Body mass index is 21.46 kg/m.  Wt Readings from Last 3 Encounters:  08/23/24 125 lb (56.7 kg)  08/15/24 122 lb 3.2 oz (55.4 kg)  01/25/24 124 lb (56.2 kg)     Reason for visit: BP check. Ankles swollen. Review Labs Performed today: Vitals, Consulted Dr. Bernie Changes (medications, testing, etc.) : Add Lasix  20mg  1 tablet daily Length of Visit: 15  minutes    Medications Adjustments/Labs and Tests Ordered: No orders of the defined types were placed in this encounter.  No orders of the defined types were placed in this encounter.    Signed, Olam JONETTA Lesches, RN  08/23/2024 1:32 PM

## 2024-08-29 ENCOUNTER — Ambulatory Visit: Admitting: Cardiology

## 2024-09-06 ENCOUNTER — Telehealth: Payer: Self-pay | Admitting: Cardiology

## 2024-09-06 NOTE — Telephone Encounter (Signed)
 Pt c/o swelling/edema: STAT if pt has developed SOB within 24 hours  If swelling, where is the swelling located? Feet and ankles / getting a little worse in the last 3 or 4 days   How much weight have you gained and in what time span? Na   Have you gained 2 pounds in a day or 5 pounds in a week? Na   Do you have a log of your daily weights (if so, list)? Na   Are you currently taking a fluid pill? Yes 1x daily   Are you currently SOB? Pt denies any SOB   Have you traveled recently in a car or plane for an extended period of time? Na    Best number [phone]

## 2024-09-06 NOTE — Telephone Encounter (Signed)
 Advised to keep daily log of weight and notify us  of weight gain 2-3 pounds in a day or 5 pounds in a week. Pt verbalized understanding and had no additional questions.

## 2024-09-18 ENCOUNTER — Other Ambulatory Visit: Payer: Self-pay

## 2024-09-20 ENCOUNTER — Encounter: Payer: Self-pay | Admitting: Cardiology

## 2024-09-20 ENCOUNTER — Ambulatory Visit: Attending: Cardiology | Admitting: Cardiology

## 2024-09-20 VITALS — BP 156/90 | HR 76 | Ht 64.0 in | Wt 126.2 lb

## 2024-09-20 DIAGNOSIS — I471 Supraventricular tachycardia, unspecified: Secondary | ICD-10-CM

## 2024-09-20 DIAGNOSIS — I10 Essential (primary) hypertension: Secondary | ICD-10-CM | POA: Diagnosis not present

## 2024-09-20 DIAGNOSIS — I447 Left bundle-branch block, unspecified: Secondary | ICD-10-CM

## 2024-09-20 DIAGNOSIS — I420 Dilated cardiomyopathy: Secondary | ICD-10-CM | POA: Diagnosis not present

## 2024-09-20 NOTE — Patient Instructions (Signed)

## 2024-09-20 NOTE — Progress Notes (Signed)
 Cardiology Office Note:    Date:  09/20/2024   ID:  Melanie Hampton, DOB June 20, 1941, MRN [id number]  PCP:  Patient, No Pcp Per  Cardiologist:  Lamar Fitch, MD    Referring MD: No ref. provider found   No chief complaint on file. Doing fine  History of Present Illness:    Melanie Hampton is a 83 y.o. female past medical history significant for cardiomyopathy with ejection fraction 4045%, left bundle branch block, stress test recently done negative, supraventricular tachycardia, essential hypertension.  Comes today to months for follow-up blood pressure seems to be better controlled.  She denies have any chest pain tightness squeezing pressure burning chest she complained about having bruising.  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   Gastroesophageal reflux disease without esophagitis 02/07/2016   GERD (gastroesophageal reflux disease)    Insect bite of neck 05/05/2023   Inverted nipple 06/04/2020   Left bundle branch block 08/15/2024   Leukocytes in urine 02/07/2016   Migraine    Migraine headache 02/07/2016   Musculoskeletal disorder involving upper trapezius muscle 06/09/2021   Neck pain 10/19/2023   Nipple discharge 06/04/2020   Open wound of right upper arm 08/03/2024   Paroxysmal supraventricular tachycardia 02/07/2016   Stress incontinence of urine 03/08/2019   Tick bite 05/05/2023   UTI symptoms 04/30/2019    Past Surgical History:  Procedure Laterality Date   TONSILLECTOMY     TUBAL LIGATION     uterus polyp removal      Current Medications: Current Meds  Medication Sig   aspirin EC  81 MG tablet Take 81 mg by mouth daily.   atorvastatin  (LIPITOR) 10 MG tablet Take 1 tablet by mouth once daily   Chlorpheniramine Maleate (ALLERGY RELIEF PO) Take 1 tablet by mouth daily.   esomeprazole (NEXIUM) 20 MG capsule Take 20 mg by mouth daily at 12 noon.   furosemide  (LASIX ) 20 MG tablet Take 1 tablet (20 mg total) by mouth daily.   lisinopril  (ZESTRIL ) 10 MG tablet Take 1 tablet (10 mg total) by mouth daily.   MYRBETRIQ 50 MG TB24 tablet Take 50 mg by mouth daily.   polyethylene glycol powder (GLYCOLAX/MIRALAX) 17 GM/SCOOP powder Take 17 g by mouth daily as needed for moderate constipation or mild constipation.   propranolol  ER (INDERAL  LA) 80 MG 24 hr capsule Take 1 capsule (80 mg total) by mouth daily.   scopolamine (TRANSDERM-SCOP) 1 MG/3DAYS Place 1 patch onto the skin every three (3) days as needed.   solifenacin (VESICARE) 5 MG tablet Take 5 mg by mouth daily.   triamcinolone cream (KENALOG) 0.5 % Apply 1 Application topically 2 (two) times daily as needed (fire ant and tick bites).     Allergies:   Niacin, Penicillin g, and Amoxicillin-pot clavulanate   Social History   Socioeconomic History   Marital status: Widowed    Spouse name: Not on file   Number of children: Not on file   Years of education: Not on file   Highest education level: Not on file  Occupational History   Not on file  Tobacco Use   Smoking status: Never   Smokeless tobacco: Never  Vaping  Use   Vaping status: Never Used  Substance and Sexual Activity   Alcohol use: No   Drug use: No   Sexual activity: Not Currently  Other Topics Concern   Not on file  Social History Narrative   Not on file   Social Drivers of Health   Financial Resource Strain: Not on file  Food Insecurity: Not on file  Transportation Needs: Not on file  Physical Activity: Not on file  Stress: Not on file  Social Connections: Not on file     Family History: The patient's family history includes Cancer in her  sister; Hypertension in her mother; Stroke in her father. ROS:   Please see the history of present illness.    All 14 point review of systems negative except as described per history of present illness  EKGs/Labs/Other Studies Reviewed:         Recent Labs: 01/25/2024: ALT 14 08/22/2024: BUN 13; Creatinine, Ser 0.86; NT-Pro BNP 599; Potassium 4.6; Sodium 145  Recent Lipid Panel    Component Value Date/Time   CHOL 200 (H) 01/25/2024 1205   TRIG 78 01/25/2024 1205   HDL 82 01/25/2024 1205   CHOLHDL 2.4 01/25/2024 1205   LDLCALC 104 (H) 01/25/2024 1205    Physical Exam:    VS:  BP (!) 156/90   Pulse 76   Ht 5' 4 (1.626 m)   Wt 126 lb 3.2 oz (57.2 kg)   LMP  (LMP Unknown)   SpO2 97%   BMI 21.66 kg/m     Wt Readings from Last 3 Encounters:  09/20/24 126 lb 3.2 oz (57.2 kg)  08/23/24 125 lb (56.7 kg)  08/15/24 122 lb 3.2 oz (55.4 kg)     GEN:  Well nourished, well developed in no acute distress HEENT: Normal NECK: No JVD; No carotid bruits LYMPHATICS: No lymphadenopathy CARDIAC: RRR, no murmurs, no rubs, no gallops RESPIRATORY:  Clear to auscultation without rales, wheezing or rhonchi  ABDOMEN: Soft, non-tender, non-distended MUSCULOSKELETAL:  No edema; No deformity  SKIN: Warm and dry LOWER EXTREMITIES: no swelling NEUROLOGIC:  Alert and oriented x 3 PSYCHIATRIC:  Normal affect   ASSESSMENT:    1. 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 Left bundle branch block   3. Paroxysmal supraventricular tachycardia   4. Benign essential hypertension    PLAN:    In order of problems listed above:  History of cardiomyopathy and on guideline directed medical therapy.  She does not want to switch lisinopril  to Entresto we continue present management.  I suspect diminished ejection fraction is related to left bundle branch block rather than myopathy.  Overall hemodynamically stable. Left bundle branch block.  Noted. Supraventricular tachycardia denies  have any palpitations. Benign essential hypertension blood pressure well-controlled continue present management.  Blood pressure elevated in the office but she brought blood pressure measurements from home it is always good.   Medication Adjustments/Labs and Tests Ordered: Current medicines are reviewed at length with the patient today.  Concerns regarding medicines are outlined above.  No orders of the defined types were placed in this encounter.  Medication changes: No orders of the defined types were placed in this encounter.   Signed, Lamar DOROTHA Fitch, MD, [facility] 09/20/2024 1:14 PM    [facility]

## 2024-12-15 ENCOUNTER — Encounter: Payer: Self-pay | Admitting: *Deleted

## 2024-12-19 ENCOUNTER — Ambulatory Visit: Attending: Cardiology | Admitting: Cardiology

## 2024-12-19 ENCOUNTER — Encounter: Payer: Self-pay | Admitting: Cardiology

## 2024-12-19 VITALS — BP 148/86 | HR 69 | Ht 64.0 in | Wt 124.0 lb

## 2024-12-19 DIAGNOSIS — I420 Dilated cardiomyopathy: Secondary | ICD-10-CM | POA: Diagnosis not present

## 2024-12-19 DIAGNOSIS — I447 Left bundle-branch block, unspecified: Secondary | ICD-10-CM | POA: Diagnosis not present

## 2024-12-19 DIAGNOSIS — I10 Essential (primary) hypertension: Secondary | ICD-10-CM | POA: Diagnosis not present

## 2024-12-19 DIAGNOSIS — I471 Supraventricular tachycardia, unspecified: Secondary | ICD-10-CM | POA: Diagnosis not present

## 2024-12-19 MED ORDER — PROPRANOLOL HCL ER 80 MG PO CP24: 80.0000 mg | ORAL_CAPSULE | Freq: Every day | ORAL | 3 refills | Status: DC

## 2024-12-19 MED ORDER — LISINOPRIL 10 MG PO TABS: 10.0000 mg | ORAL_TABLET | Freq: Every day | ORAL | 3 refills | Status: DC

## 2024-12-19 MED ORDER — FUROSEMIDE 20 MG PO TABS: 20.0000 mg | ORAL_TABLET | Freq: Every day | ORAL | 3 refills | Status: AC

## 2024-12-19 MED ORDER — FUROSEMIDE 20 MG PO TABS: 20.0000 mg | ORAL_TABLET | Freq: Every day | ORAL | 3 refills | Status: DC

## 2024-12-19 MED ORDER — PROPRANOLOL HCL ER 80 MG PO CP24: 80.0000 mg | ORAL_CAPSULE | Freq: Every day | ORAL | 3 refills | Status: AC

## 2024-12-19 MED ORDER — LISINOPRIL 10 MG PO TABS: 10.0000 mg | ORAL_TABLET | Freq: Every day | ORAL | 3 refills | Status: AC

## 2024-12-19 NOTE — Addendum Note (Signed)
 Addended by: GLENFORD ALAN CROME on: 12/19/2024 01:51 PM   Modules accepted: Orders

## 2024-12-19 NOTE — Patient Instructions (Signed)

## 2024-12-19 NOTE — Progress Notes (Signed)
 " Cardiology Office Note:    Date:  12/19/2024   ID:  Melanie Hampton, DOB 14-Aug-1941, MRN [id number]  PCP:  Patient, No Pcp Per  Cardiologist:  Lamar Fitch, MD    Referring MD: No ref. provider found   Chief Complaint  Patient presents with   Follow-up    History of Present Illness:    Melanie Hampton is a 83 y.o. female cardiomyopathy ejection fraction 40 to 45%, left bundle branch block, stress test negative, supraventricular tachycardia, essential hypertension.  Comes today to months for follow-up overall doing fine.  Denies have any chest pain tightness squeezing pressure burning chest she said she cannot do as much as she used to but still doing pretty well considering her overall her condition.  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   Gastroesophageal reflux disease without esophagitis 02/07/2016   GERD (gastroesophageal reflux disease)    Insect bite of neck 05/05/2023   Inverted nipple 06/04/2020   Left bundle branch block 08/15/2024   Leukocytes in urine 02/07/2016   Migraine    Migraine headache 02/07/2016   Musculoskeletal disorder involving upper trapezius muscle 06/09/2021   Neck pain 10/19/2023   Nipple discharge 06/04/2020   Open wound of right upper arm 08/03/2024   Paroxysmal supraventricular tachycardia 02/07/2016   Stress incontinence of urine 03/08/2019   Tick bite 05/05/2023   UTI symptoms 04/30/2019    Past Surgical History:  Procedure Laterality Date   TONSILLECTOMY     TUBAL LIGATION     uterus polyp removal      Current Medications: Active Medications[1]    Allergies:   Niacin, Penicillin g, and Amoxicillin-pot clavulanate   Social History   Socioeconomic History   Marital status: Widowed    Spouse name: Not on file   Number of children: Not on file   Years of education: Not on file   Highest education level: Not on file  Occupational History   Not on file  Tobacco Use   Smoking status: Never   Smokeless tobacco: Never  Vaping Use   Vaping status: Never Used  Substance and Sexual Activity   Alcohol use: No   Drug use: No   Sexual activity: Not Currently  Other Topics Concern   Not on file  Social History Narrative   Not on file   Social Drivers of Health   Tobacco Use: Low Risk (12/19/2024)   Patient History    Smoking Tobacco Use: Never    Smokeless Tobacco Use: Never    Passive Exposure: Not on file  Financial Resource Strain: Not on file  Food Insecurity: Not on file  Transportation Needs: Not on file  Physical Activity: Not on file  Stress: Not on file  Social Connections: Not on file  Depression (EYV7-0): Not on file  Alcohol Screen: Not on file  Housing: Not on file  Utilities: Not on file  Health Literacy: Not on file     Family History: The patient's family history includes Cancer in her sister; Hypertension in her mother; Stroke in her father. ROS:   Please see the history of present illness.    All 14 point review of systems negative  except as described per history of present illness  EKGs/Labs/Other Studies Reviewed:    EKG Interpretation Date/Time:  Tuesday December 19 2024 13:33:42 EST Ventricular Rate:  69 PR Interval:  160 QRS Duration:  132 QT Interval:  412 QTC Calculation: 441 R Axis:   -5  Text Interpretation: Normal sinus rhythm Non-specific intra-ventricular conduction block Minimal voltage criteria for LVH, may be normal variant ( Cornell product ) Cannot rule out Anterior infarct (cited on or before 19-Dec-2024) When compared with ECG of 25-Jan-2024 11:19, No significant change  was found Confirmed by Bernie Charleston [phone]) on 12/19/2024 1:36:37 PM    Recent Labs: 01/25/2024: ALT 14 08/22/2024: BUN 13; Creatinine, Ser 0.86; NT-Pro BNP 599; Potassium 4.6; Sodium 145  Recent Lipid Panel    Component Value Date/Time   CHOL 200 (H) 01/25/2024 1205   TRIG 78 01/25/2024 1205   HDL 82 01/25/2024 1205   CHOLHDL 2.4 01/25/2024 1205   LDLCALC 104 (H) 01/25/2024 1205    Physical Exam:    VS:  BP (!) 148/86   Pulse 69   Ht 5' 4 (1.626 m)   Wt 124 lb (56.2 kg)   LMP  (LMP Unknown)   SpO2 99%   BMI 21.28 kg/m     Wt Readings from Last 3 Encounters:  12/19/24 124 lb (56.2 kg)  09/20/24 126 lb 3.2 oz (57.2 kg)  08/23/24 125 lb (56.7 kg)     GEN:  Well nourished, well developed in no acute distress HEENT: Normal NECK: No JVD; No carotid bruits LYMPHATICS: No lymphadenopathy CARDIAC: RRR, no murmurs, no rubs, no gallops RESPIRATORY:  Clear to auscultation without rales, wheezing or rhonchi  ABDOMEN: Soft, non-tender, non-distended MUSCULOSKELETAL:  No edema; No deformity  SKIN: Warm and dry LOWER EXTREMITIES: no swelling NEUROLOGIC:  Alert and oriented x 3 PSYCHIATRIC:  Normal affect   ASSESSMENT:    1. Paroxysmal supraventricular tachycardia   2. Benign essential hypertension   3. 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 Left bundle branch block    PLAN:    In order of problems listed above:  Cardiomyopathy with mildly diminished left ventricular ejection fraction is probably simply related to left bundle branch block, stress test was negative.  Having difficulty putting her guideline directed medical therapy she is happy the way she is we will do refill on medications for her.  Continue rest. Essential hypertension always elevated in the office but she brought blood pressure measurements from home always good continue present management. Left bundle branch block.  Noted. Dyslipidemia I did review KPN which show me LDL of 104 HDL  83 decent control continue present management   Medication Adjustments/Labs and Tests Ordered: Current medicines are reviewed at length with the patient today.  Concerns regarding medicines are outlined above.  Orders Placed This Encounter  Procedures   EKG 12-Lead   Medication changes:  Meds ordered this encounter  Medications   furosemide  (LASIX ) 20 MG tablet    Sig: Take 1 tablet (20 mg total) by mouth daily.    Dispense:  90 tablet    Refill:  3   lisinopril  (ZESTRIL ) 10 MG tablet    Sig: Take 1 tablet (10 mg total) by mouth daily.    Dispense:  90 tablet    Refill:  3   propranolol  ER (INDERAL  LA) 80 MG 24 hr capsule    Sig: Take 1 capsule (80 mg total) by mouth daily.    Dispense:  90 capsule  Refill:  3    Signed, Lamar DOROTHA Fitch, MD, [facility] 12/19/2024 1:47 PM    [facility]    [1]  Current Meds  Medication Sig   aspirin EC 81 MG tablet Take 81 mg by mouth daily.   atorvastatin  (LIPITOR) 10 MG tablet Take 1 tablet by mouth once daily   Chlorpheniramine Maleate (ALLERGY RELIEF PO) Take 1 tablet by mouth daily.   esomeprazole (NEXIUM) 20 MG capsule Take 20 mg by mouth daily at 12 noon.   furosemide  (LASIX ) 20 MG tablet Take 1 tablet (20 mg total) by mouth daily.   lisinopril  (ZESTRIL ) 10 MG tablet Take 1 tablet (10 mg total) by mouth daily.   MYRBETRIQ 50 MG TB24 tablet Take 50 mg by mouth daily.   polyethylene glycol powder (GLYCOLAX/MIRALAX) 17 GM/SCOOP powder Take 17 g by mouth daily as needed for moderate constipation or mild constipation.   propranolol  ER (INDERAL  LA) 80 MG 24 hr capsule Take 1 capsule (80 mg total) by mouth daily.   scopolamine (TRANSDERM-SCOP) 1 MG/3DAYS Place 1 patch onto the skin every three (3) days as needed.   solifenacin (VESICARE) 5 MG tablet Take 5 mg by mouth daily.   triamcinolone cream (KENALOG) 0.5 % Apply 1 Application topically 2 (two) times daily as needed (fire ant and tick bites).   [DISCONTINUED]  furosemide  (LASIX ) 20 MG tablet Take 1 tablet (20 mg total) by mouth daily.   [DISCONTINUED] lisinopril  (ZESTRIL ) 10 MG tablet Take 1 tablet (10 mg total) by mouth daily.   [DISCONTINUED] propranolol  ER (INDERAL  LA) 80 MG 24 hr capsule Take 1 capsule (80 mg total) by mouth daily.   "

## 2025-01-08 ENCOUNTER — Telehealth: Payer: Self-pay | Admitting: Cardiology

## 2025-01-08 NOTE — Telephone Encounter (Signed)
 Pt c/o BP issue: STAT if pt c/o blurred vision, one-sided weakness or slurred speech.  STAT if BP is GREATER than 180/120 TODAY.  STAT if BP is LESS than 90/60 and SYMPTOMATIC TODAY  1. What is your BP concern? High blood pressure  2. Have you taken any BP medication today? yes  3. What are your last 5 BP readings? 1/8 4pm  114/71 82 1/12 10:33 am  173/95 11:15 am 170/100 11:53am  168/99 2:08 pm 128/66   4. Are you having any other symptoms (ex. Dizziness, headache, blurred vision, passed out)? States her kidneys have been bother her, and her head was feeling too good.  She has an appt her the urologist tomorrow.

## 2025-01-08 NOTE — Telephone Encounter (Signed)
 Called the patient and she reported that her blood pressure had been elevated today. On 1/8 her blood pressure was 114/71 HR 82, then on 1/12 it was:  10:33 173/95 11:15 170/100 11:53 168/99 2:08  128/66  Patient stated that she was also having headaches and that she took her blood pressure medication at 10:30 am. I explained that she should take her medication at the same time everyday and check her blood pressure 2 hours after taking her blood pressure medication. Patient verbalized understanding and also stated that she did not have anymore headaches. Please advise.

## 2025-01-09 NOTE — Telephone Encounter (Signed)
 Called the patient and informed her of Dr. Madireddy's recommendation below:  Patient of Dr. Krasowski. Cardiomyopathy and reduced LVEF 40-45%. Now with questions and concerns about suboptimal blood pressures associated with headaches. Medications appear to be lisinopril  and propranolol  at baseline.   I agree she should take her lisinopril  and propranolol  consistently around the same time every day. Have her keep a log and if blood pressures consistently elevated, would require further uptitration of her regimen. Please forward to Dr. Krasowski.  Patient verbalized understanding and had no further questions at this time.

## 2025-02-02 ENCOUNTER — Telehealth: Payer: Self-pay | Admitting: Cardiology

## 2025-02-02 ENCOUNTER — Telehealth: Payer: Self-pay

## 2025-02-02 NOTE — Telephone Encounter (Signed)
 Pt c/o medication issue:  1. Name of Medication: hydrochlorothiazide  2. How are you currently taking this medication (dosage and times per day)? As written  3. Are you having a reaction (difficulty breathing--STAT)? No  4. What is your medication issue? Pt hit her head and went to the ED and would like to know if this medication is okay for her to take.

## 2025-02-02 NOTE — Telephone Encounter (Signed)
 Pt c/o BP issue: STAT if pt c/o blurred vision, one-sided weakness or slurred speech.  STAT if BP is GREATER than 180/120 TODAY.  STAT if BP is LESS than 90/60 and SYMPTOMATIC TODAY  1. What is your BP concern? BP is elevated   2. Have you taken any BP medication today? Yes  3. What are your last 5 BP readings? 170/104 161/96  4. Are you having any other symptoms (ex. Dizziness, headache, blurred vision, passed out)? Headache from her fall

## 2025-02-02 NOTE — Telephone Encounter (Signed)
 Pt called reporting that she fell recently and went to the hospital. Her CT was normal. She wanted to make an appt to go over her medications with Dr. Bernie. Sent to front desk to make appt.

## 2025-02-07 ENCOUNTER — Ambulatory Visit: Admitting: Cardiology
